# Patient Record
Sex: Female | Born: 1935
Health system: Southern US, Community
[De-identification: ages and names within clinical notes are randomized; demographics above are authoritative.]

## PROBLEM LIST (undated history)

## (undated) DIAGNOSIS — I499 Cardiac arrhythmia, unspecified: Secondary | ICD-10-CM

## (undated) DIAGNOSIS — E785 Hyperlipidemia, unspecified: Secondary | ICD-10-CM

## (undated) DIAGNOSIS — R413 Other amnesia: Secondary | ICD-10-CM

## (undated) DIAGNOSIS — R918 Other nonspecific abnormal finding of lung field: Secondary | ICD-10-CM

## (undated) DIAGNOSIS — K5790 Diverticulosis of intestine, part unspecified, without perforation or abscess without bleeding: Secondary | ICD-10-CM

## (undated) DIAGNOSIS — M1990 Unspecified osteoarthritis, unspecified site: Secondary | ICD-10-CM

## (undated) DIAGNOSIS — F419 Anxiety disorder, unspecified: Secondary | ICD-10-CM

## (undated) DIAGNOSIS — F32A Depression, unspecified: Secondary | ICD-10-CM

## (undated) DIAGNOSIS — F0280 Dementia in other diseases classified elsewhere without behavioral disturbance: Secondary | ICD-10-CM

## (undated) DIAGNOSIS — F329 Major depressive disorder, single episode, unspecified: Secondary | ICD-10-CM

## (undated) DIAGNOSIS — H3530 Unspecified macular degeneration: Secondary | ICD-10-CM

## (undated) DIAGNOSIS — I10 Essential (primary) hypertension: Secondary | ICD-10-CM

## (undated) DIAGNOSIS — G309 Alzheimer's disease, unspecified: Secondary | ICD-10-CM

## (undated) HISTORY — DX: Anxiety disorder, unspecified: F41.9

## (undated) HISTORY — DX: Major depressive disorder, single episode, unspecified: F32.9

## (undated) HISTORY — PX: BREAST ENHANCEMENT SURGERY: SHX7

## (undated) HISTORY — DX: Hyperlipidemia, unspecified: E78.5

## (undated) HISTORY — DX: Dementia in other diseases classified elsewhere without behavioral disturbance: F02.80

## (undated) HISTORY — DX: Depression, unspecified: F32.A

## (undated) HISTORY — DX: Other nonspecific abnormal finding of lung field: R91.8

## (undated) HISTORY — DX: Unspecified osteoarthritis, unspecified site: M19.90

## (undated) HISTORY — PX: TONSILLECTOMY: SUR1361

## (undated) HISTORY — DX: Essential (primary) hypertension: I10

## (undated) HISTORY — DX: Alzheimer's disease, unspecified: G30.9

## (undated) HISTORY — DX: Other amnesia: R41.3

## (undated) HISTORY — DX: Unspecified macular degeneration: H35.30

## (undated) HISTORY — DX: Diverticulosis of intestine, part unspecified, without perforation or abscess without bleeding: K57.90

---

## 1998-06-15 ENCOUNTER — Other Ambulatory Visit: Admission: RE | Admit: 1998-06-15 | Discharge: 1998-06-15 | Payer: Self-pay | Admitting: Obstetrics and Gynecology

## 1998-09-13 ENCOUNTER — Encounter: Payer: Self-pay | Admitting: Plastic Surgery

## 1998-09-13 ENCOUNTER — Ambulatory Visit (HOSPITAL_COMMUNITY): Admission: RE | Admit: 1998-09-13 | Discharge: 1998-09-13 | Payer: Self-pay | Admitting: Plastic Surgery

## 1998-09-19 ENCOUNTER — Other Ambulatory Visit: Admission: RE | Admit: 1998-09-19 | Discharge: 1998-09-19 | Payer: Self-pay | Admitting: Plastic Surgery

## 1999-06-13 ENCOUNTER — Encounter: Payer: Self-pay | Admitting: Orthopedic Surgery

## 1999-06-13 ENCOUNTER — Encounter: Admission: RE | Admit: 1999-06-13 | Discharge: 1999-06-13 | Payer: Self-pay | Admitting: Orthopedic Surgery

## 1999-09-06 ENCOUNTER — Other Ambulatory Visit: Admission: RE | Admit: 1999-09-06 | Discharge: 1999-09-06 | Payer: Self-pay | Admitting: Obstetrics and Gynecology

## 2001-05-21 ENCOUNTER — Other Ambulatory Visit: Admission: RE | Admit: 2001-05-21 | Discharge: 2001-05-21 | Payer: Self-pay | Admitting: Obstetrics and Gynecology

## 2001-05-27 ENCOUNTER — Encounter: Payer: Self-pay | Admitting: Obstetrics and Gynecology

## 2001-05-27 ENCOUNTER — Encounter: Admission: RE | Admit: 2001-05-27 | Discharge: 2001-05-27 | Payer: Self-pay | Admitting: Obstetrics and Gynecology

## 2002-03-24 ENCOUNTER — Encounter (INDEPENDENT_AMBULATORY_CARE_PROVIDER_SITE_OTHER): Payer: Self-pay | Admitting: Family Medicine

## 2003-06-28 ENCOUNTER — Other Ambulatory Visit: Admission: RE | Admit: 2003-06-28 | Discharge: 2003-06-28 | Payer: Self-pay | Admitting: Obstetrics and Gynecology

## 2003-07-30 HISTORY — PX: OTHER SURGICAL HISTORY: SHX169

## 2004-12-13 ENCOUNTER — Other Ambulatory Visit: Admission: RE | Admit: 2004-12-13 | Discharge: 2004-12-13 | Payer: Self-pay | Admitting: Obstetrics and Gynecology

## 2006-07-07 ENCOUNTER — Encounter (INDEPENDENT_AMBULATORY_CARE_PROVIDER_SITE_OTHER): Payer: Self-pay | Admitting: Family Medicine

## 2006-10-09 ENCOUNTER — Other Ambulatory Visit: Admission: RE | Admit: 2006-10-09 | Discharge: 2006-10-09 | Payer: Self-pay | Admitting: Obstetrics & Gynecology

## 2007-04-24 ENCOUNTER — Ambulatory Visit: Payer: Self-pay | Admitting: Family Medicine

## 2007-04-24 DIAGNOSIS — J984 Other disorders of lung: Secondary | ICD-10-CM | POA: Insufficient documentation

## 2007-04-24 DIAGNOSIS — H9190 Unspecified hearing loss, unspecified ear: Secondary | ICD-10-CM | POA: Insufficient documentation

## 2007-04-24 DIAGNOSIS — G4700 Insomnia, unspecified: Secondary | ICD-10-CM | POA: Insufficient documentation

## 2007-04-24 DIAGNOSIS — E785 Hyperlipidemia, unspecified: Secondary | ICD-10-CM | POA: Insufficient documentation

## 2007-04-24 DIAGNOSIS — J301 Allergic rhinitis due to pollen: Secondary | ICD-10-CM | POA: Insufficient documentation

## 2007-04-24 DIAGNOSIS — R498 Other voice and resonance disorders: Secondary | ICD-10-CM | POA: Insufficient documentation

## 2007-05-01 ENCOUNTER — Encounter (INDEPENDENT_AMBULATORY_CARE_PROVIDER_SITE_OTHER): Payer: Self-pay | Admitting: Family Medicine

## 2007-05-05 LAB — CONVERTED CEMR LAB
ALT: 17 units/L (ref 0–35)
Alkaline Phosphatase: 62 units/L (ref 39–117)
CO2: 25 meq/L (ref 19–32)
Calcium: 8.9 mg/dL (ref 8.4–10.5)
Cholesterol: 212 mg/dL — ABNORMAL HIGH (ref 0–200)
Eosinophils Relative: 2 % (ref 0–5)
Glucose, Bld: 91 mg/dL (ref 70–99)
HCT: 43.8 % (ref 36.0–46.0)
Hemoglobin: 14.4 g/dL (ref 12.0–15.0)
LDL Cholesterol: 116 mg/dL — ABNORMAL HIGH (ref 0–99)
Lymphocytes Relative: 29 % (ref 12–46)
MCHC: 32.9 g/dL (ref 30.0–36.0)
Monocytes Absolute: 0.7 10*3/uL (ref 0.2–0.7)
Neutrophils Relative %: 60 % (ref 43–77)
Platelets: 244 10*3/uL (ref 150–400)
Sodium: 143 meq/L (ref 135–145)
Total Protein: 6.3 g/dL (ref 6.0–8.3)
WBC: 7.8 10*3/uL (ref 4.0–10.5)

## 2007-05-08 ENCOUNTER — Ambulatory Visit: Payer: Self-pay | Admitting: Family Medicine

## 2007-05-08 DIAGNOSIS — F341 Dysthymic disorder: Secondary | ICD-10-CM | POA: Insufficient documentation

## 2007-05-08 LAB — CONVERTED CEMR LAB
Cholesterol, target level: 200 mg/dL
HDL goal, serum: 40 mg/dL
LDL Goal: 160 mg/dL

## 2007-07-08 ENCOUNTER — Telehealth (INDEPENDENT_AMBULATORY_CARE_PROVIDER_SITE_OTHER): Payer: Self-pay | Admitting: *Deleted

## 2007-07-08 ENCOUNTER — Ambulatory Visit: Payer: Self-pay | Admitting: Family Medicine

## 2007-07-08 DIAGNOSIS — M949 Disorder of cartilage, unspecified: Secondary | ICD-10-CM

## 2007-07-08 DIAGNOSIS — M899 Disorder of bone, unspecified: Secondary | ICD-10-CM | POA: Insufficient documentation

## 2007-07-09 ENCOUNTER — Encounter (INDEPENDENT_AMBULATORY_CARE_PROVIDER_SITE_OTHER): Payer: Self-pay | Admitting: Family Medicine

## 2007-07-09 ENCOUNTER — Telehealth (INDEPENDENT_AMBULATORY_CARE_PROVIDER_SITE_OTHER): Payer: Self-pay | Admitting: Family Medicine

## 2007-07-09 ENCOUNTER — Telehealth (INDEPENDENT_AMBULATORY_CARE_PROVIDER_SITE_OTHER): Payer: Self-pay | Admitting: *Deleted

## 2007-07-10 ENCOUNTER — Ambulatory Visit (HOSPITAL_COMMUNITY): Admission: RE | Admit: 2007-07-10 | Discharge: 2007-07-10 | Payer: Self-pay | Admitting: Family Medicine

## 2007-07-10 ENCOUNTER — Encounter (INDEPENDENT_AMBULATORY_CARE_PROVIDER_SITE_OTHER): Payer: Self-pay | Admitting: Family Medicine

## 2007-07-13 ENCOUNTER — Ambulatory Visit (HOSPITAL_COMMUNITY): Admission: RE | Admit: 2007-07-13 | Discharge: 2007-07-13 | Payer: Self-pay | Admitting: Family Medicine

## 2007-07-14 ENCOUNTER — Telehealth (INDEPENDENT_AMBULATORY_CARE_PROVIDER_SITE_OTHER): Payer: Self-pay | Admitting: *Deleted

## 2007-07-28 ENCOUNTER — Telehealth (INDEPENDENT_AMBULATORY_CARE_PROVIDER_SITE_OTHER): Payer: Self-pay | Admitting: *Deleted

## 2007-07-30 HISTORY — PX: CATARACT EXTRACTION: SUR2

## 2007-08-17 ENCOUNTER — Telehealth (INDEPENDENT_AMBULATORY_CARE_PROVIDER_SITE_OTHER): Payer: Self-pay | Admitting: *Deleted

## 2007-08-17 ENCOUNTER — Ambulatory Visit: Payer: Self-pay | Admitting: Family Medicine

## 2007-08-17 DIAGNOSIS — R413 Other amnesia: Secondary | ICD-10-CM | POA: Insufficient documentation

## 2007-08-17 DIAGNOSIS — R9409 Abnormal results of other function studies of central nervous system: Secondary | ICD-10-CM | POA: Insufficient documentation

## 2007-08-18 ENCOUNTER — Encounter (INDEPENDENT_AMBULATORY_CARE_PROVIDER_SITE_OTHER): Payer: Self-pay | Admitting: Family Medicine

## 2007-08-25 ENCOUNTER — Ambulatory Visit: Payer: Self-pay | Admitting: Family Medicine

## 2007-09-30 ENCOUNTER — Ambulatory Visit: Payer: Self-pay | Admitting: Vascular Surgery

## 2007-10-07 ENCOUNTER — Encounter (INDEPENDENT_AMBULATORY_CARE_PROVIDER_SITE_OTHER): Payer: Self-pay | Admitting: Family Medicine

## 2007-10-22 ENCOUNTER — Telehealth (INDEPENDENT_AMBULATORY_CARE_PROVIDER_SITE_OTHER): Payer: Self-pay | Admitting: *Deleted

## 2007-11-03 ENCOUNTER — Telehealth (INDEPENDENT_AMBULATORY_CARE_PROVIDER_SITE_OTHER): Payer: Self-pay | Admitting: Family Medicine

## 2007-11-10 ENCOUNTER — Telehealth (INDEPENDENT_AMBULATORY_CARE_PROVIDER_SITE_OTHER): Payer: Self-pay | Admitting: Family Medicine

## 2007-11-16 ENCOUNTER — Telehealth (INDEPENDENT_AMBULATORY_CARE_PROVIDER_SITE_OTHER): Payer: Self-pay | Admitting: Family Medicine

## 2007-11-17 ENCOUNTER — Ambulatory Visit: Payer: Self-pay | Admitting: Family Medicine

## 2007-11-17 DIAGNOSIS — R030 Elevated blood-pressure reading, without diagnosis of hypertension: Secondary | ICD-10-CM | POA: Insufficient documentation

## 2007-11-18 ENCOUNTER — Telehealth (INDEPENDENT_AMBULATORY_CARE_PROVIDER_SITE_OTHER): Payer: Self-pay | Admitting: *Deleted

## 2007-11-27 ENCOUNTER — Other Ambulatory Visit: Admission: RE | Admit: 2007-11-27 | Discharge: 2007-11-27 | Payer: Self-pay | Admitting: Obstetrics & Gynecology

## 2007-11-27 ENCOUNTER — Telehealth (INDEPENDENT_AMBULATORY_CARE_PROVIDER_SITE_OTHER): Payer: Self-pay | Admitting: *Deleted

## 2007-11-30 ENCOUNTER — Encounter (INDEPENDENT_AMBULATORY_CARE_PROVIDER_SITE_OTHER): Payer: Self-pay | Admitting: Family Medicine

## 2008-02-09 ENCOUNTER — Ambulatory Visit (HOSPITAL_COMMUNITY): Admission: RE | Admit: 2008-02-09 | Discharge: 2008-02-09 | Payer: Self-pay | Admitting: Ophthalmology

## 2008-02-16 ENCOUNTER — Encounter (INDEPENDENT_AMBULATORY_CARE_PROVIDER_SITE_OTHER): Payer: Self-pay | Admitting: Family Medicine

## 2008-02-18 ENCOUNTER — Ambulatory Visit: Payer: Self-pay | Admitting: Family Medicine

## 2008-02-18 DIAGNOSIS — H2640 Unspecified secondary cataract: Secondary | ICD-10-CM | POA: Insufficient documentation

## 2008-02-18 DIAGNOSIS — IMO0002 Reserved for concepts with insufficient information to code with codable children: Secondary | ICD-10-CM | POA: Insufficient documentation

## 2008-02-18 DIAGNOSIS — M1710 Unilateral primary osteoarthritis, unspecified knee: Secondary | ICD-10-CM

## 2008-05-11 ENCOUNTER — Ambulatory Visit: Payer: Self-pay | Admitting: Family Medicine

## 2008-05-11 DIAGNOSIS — H5310 Unspecified subjective visual disturbances: Secondary | ICD-10-CM | POA: Insufficient documentation

## 2008-06-01 ENCOUNTER — Ambulatory Visit: Payer: Self-pay | Admitting: Family Medicine

## 2008-06-02 ENCOUNTER — Encounter (INDEPENDENT_AMBULATORY_CARE_PROVIDER_SITE_OTHER): Payer: Self-pay | Admitting: Family Medicine

## 2008-06-07 ENCOUNTER — Encounter: Payer: Self-pay | Admitting: Orthopedic Surgery

## 2008-06-07 ENCOUNTER — Encounter (INDEPENDENT_AMBULATORY_CARE_PROVIDER_SITE_OTHER): Payer: Self-pay | Admitting: Family Medicine

## 2008-07-29 HISTORY — PX: REFRACTIVE SURGERY: SHX103

## 2008-11-04 ENCOUNTER — Telehealth (INDEPENDENT_AMBULATORY_CARE_PROVIDER_SITE_OTHER): Payer: Self-pay | Admitting: Family Medicine

## 2008-11-14 ENCOUNTER — Telehealth (INDEPENDENT_AMBULATORY_CARE_PROVIDER_SITE_OTHER): Payer: Self-pay | Admitting: Family Medicine

## 2008-11-16 ENCOUNTER — Encounter (INDEPENDENT_AMBULATORY_CARE_PROVIDER_SITE_OTHER): Payer: Self-pay | Admitting: Family Medicine

## 2008-11-17 LAB — CONVERTED CEMR LAB
AST: 20 units/L (ref 0–37)
Alkaline Phosphatase: 62 units/L (ref 39–117)
BUN: 21 mg/dL (ref 6–23)
Basophils Absolute: 0 10*3/uL (ref 0.0–0.1)
Basophils Relative: 0 % (ref 0–1)
Chloride: 106 meq/L (ref 96–112)
Cholesterol: 225 mg/dL — ABNORMAL HIGH (ref 0–200)
Eosinophils Absolute: 0 10*3/uL (ref 0.0–0.7)
Eosinophils Relative: 1 % (ref 0–5)
Glucose, Bld: 114 mg/dL — ABNORMAL HIGH (ref 70–99)
HDL: 81 mg/dL (ref >39)
Hemoglobin: 13.9 g/dL (ref 12.0–15.0)
Lymphocytes Relative: 23 % (ref 12–46)
Lymphs Abs: 1.9 10*3/uL (ref 0.7–4.0)
MCHC: 33.6 g/dL (ref 30.0–36.0)
MCV: 90.2 fL (ref 78.0–100.0)
Neutro Abs: 5.5 10*3/uL (ref 1.7–7.7)
Platelets: 275 10*3/uL (ref 150–400)
RBC: 4.59 M/uL (ref 3.87–5.11)
Total CHOL/HDL Ratio: 2.8
Total Protein: 6.9 g/dL (ref 6.0–8.3)
Triglycerides: 107 mg/dL (ref <150)

## 2008-11-18 ENCOUNTER — Ambulatory Visit: Payer: Self-pay | Admitting: Family Medicine

## 2008-11-18 DIAGNOSIS — R7309 Other abnormal glucose: Secondary | ICD-10-CM | POA: Insufficient documentation

## 2008-11-18 LAB — CONVERTED CEMR LAB

## 2008-12-15 ENCOUNTER — Encounter (INDEPENDENT_AMBULATORY_CARE_PROVIDER_SITE_OTHER): Payer: Self-pay | Admitting: Family Medicine

## 2009-02-23 ENCOUNTER — Ambulatory Visit: Payer: Self-pay | Admitting: Family Medicine

## 2009-02-23 DIAGNOSIS — J069 Acute upper respiratory infection, unspecified: Secondary | ICD-10-CM | POA: Insufficient documentation

## 2009-02-23 DIAGNOSIS — R002 Palpitations: Secondary | ICD-10-CM | POA: Insufficient documentation

## 2009-02-23 DIAGNOSIS — R5383 Other fatigue: Secondary | ICD-10-CM | POA: Insufficient documentation

## 2009-02-23 DIAGNOSIS — R5381 Other malaise: Secondary | ICD-10-CM | POA: Insufficient documentation

## 2009-02-27 ENCOUNTER — Telehealth (INDEPENDENT_AMBULATORY_CARE_PROVIDER_SITE_OTHER): Payer: Self-pay | Admitting: Family Medicine

## 2009-03-20 ENCOUNTER — Telehealth (INDEPENDENT_AMBULATORY_CARE_PROVIDER_SITE_OTHER): Payer: Self-pay | Admitting: *Deleted

## 2009-04-12 ENCOUNTER — Encounter (HOSPITAL_COMMUNITY): Admission: RE | Admit: 2009-04-12 | Discharge: 2009-04-26 | Payer: Self-pay | Admitting: Orthopaedic Surgery

## 2009-04-18 ENCOUNTER — Encounter (INDEPENDENT_AMBULATORY_CARE_PROVIDER_SITE_OTHER): Payer: Self-pay | Admitting: Family Medicine

## 2009-05-26 ENCOUNTER — Ambulatory Visit (HOSPITAL_COMMUNITY): Admission: RE | Admit: 2009-05-26 | Discharge: 2009-05-26 | Payer: Self-pay | Admitting: Orthopaedic Surgery

## 2009-05-29 HISTORY — PX: TOTAL KNEE ARTHROPLASTY: SHX125

## 2009-06-01 ENCOUNTER — Inpatient Hospital Stay (HOSPITAL_COMMUNITY): Admission: RE | Admit: 2009-06-01 | Discharge: 2009-06-04 | Payer: Self-pay | Admitting: Orthopaedic Surgery

## 2009-07-04 ENCOUNTER — Encounter: Payer: Self-pay | Admitting: Orthopaedic Surgery

## 2009-07-29 ENCOUNTER — Encounter: Payer: Self-pay | Admitting: Orthopaedic Surgery

## 2009-08-11 ENCOUNTER — Ambulatory Visit (HOSPITAL_COMMUNITY): Admission: RE | Admit: 2009-08-11 | Discharge: 2009-08-11 | Payer: Self-pay | Admitting: Internal Medicine

## 2010-08-19 ENCOUNTER — Encounter: Payer: Self-pay | Admitting: Obstetrics & Gynecology

## 2010-08-26 LAB — CONVERTED CEMR LAB: Hemoglobin: 15 g/dL

## 2010-09-20 ENCOUNTER — Other Ambulatory Visit: Payer: Self-pay | Admitting: Orthopedic Surgery

## 2010-09-20 DIAGNOSIS — M79672 Pain in left foot: Secondary | ICD-10-CM

## 2010-09-24 ENCOUNTER — Other Ambulatory Visit: Payer: Self-pay

## 2010-10-31 LAB — CBC
HCT: 27.8 % — ABNORMAL LOW (ref 36.0–46.0)
HCT: 31.7 % — ABNORMAL LOW (ref 36.0–46.0)
Hemoglobin: 11.4 g/dL — ABNORMAL LOW (ref 12.0–15.0)
Hemoglobin: 9.7 g/dL — ABNORMAL LOW (ref 12.0–15.0)
MCHC: 34.8 g/dL (ref 30.0–36.0)
MCV: 92.3 fL (ref 78.0–100.0)
MCV: 92.6 fL (ref 78.0–100.0)
Platelets: 174 10*3/uL (ref 150–400)
RBC: 3.01 MIL/uL — ABNORMAL LOW (ref 3.87–5.11)
RBC: 3.43 MIL/uL — ABNORMAL LOW (ref 3.87–5.11)
RBC: 3.56 MIL/uL — ABNORMAL LOW (ref 3.87–5.11)
RDW: 13.4 % (ref 11.5–15.5)
RDW: 13.5 % (ref 11.5–15.5)

## 2010-10-31 LAB — BASIC METABOLIC PANEL
BUN: 10 mg/dL (ref 6–23)
BUN: 5 mg/dL — ABNORMAL LOW (ref 6–23)
CO2: 27 mEq/L (ref 19–32)
CO2: 28 mEq/L (ref 19–32)
Calcium: 7.9 mg/dL — ABNORMAL LOW (ref 8.4–10.5)
GFR calc Af Amer: >60 mL/min (ref >60)
GFR calc non Af Amer: >60 mL/min (ref >60)
GFR calc non Af Amer: >60 mL/min (ref >60)
GFR calc non Af Amer: >60 mL/min (ref >60)
Glucose, Bld: 129 mg/dL — ABNORMAL HIGH (ref 70–99)
Glucose, Bld: 158 mg/dL — ABNORMAL HIGH (ref 70–99)
Potassium: 3.3 mEq/L — ABNORMAL LOW (ref 3.5–5.1)
Potassium: 3.5 mEq/L (ref 3.5–5.1)
Sodium: 135 mEq/L (ref 135–145)

## 2010-10-31 LAB — PROTIME-INR
Prothrombin Time: 13.7 seconds (ref 11.6–15.2)
Prothrombin Time: 20.1 seconds — ABNORMAL HIGH (ref 11.6–15.2)
Prothrombin Time: 21.7 seconds — ABNORMAL HIGH (ref 11.6–15.2)

## 2010-10-31 LAB — OSMOLALITY: Osmolality: 273 mOsm/kg — ABNORMAL LOW (ref 275–300)

## 2010-11-01 LAB — URINE CULTURE
Colony Count: NO GROWTH
Culture: NO GROWTH

## 2010-11-01 LAB — DIFFERENTIAL
Basophils Relative: 0 % (ref 0–1)
Lymphocytes Relative: 37 % (ref 12–46)
Monocytes Relative: 7 % (ref 3–12)
Neutro Abs: 3.7 10*3/uL (ref 1.7–7.7)

## 2010-11-01 LAB — COMPREHENSIVE METABOLIC PANEL
ALT: 18 U/L (ref 0–35)
Albumin: 3.9 g/dL (ref 3.5–5.2)
Alkaline Phosphatase: 65 U/L (ref 39–117)
BUN: 17 mg/dL (ref 6–23)
GFR calc non Af Amer: >60 mL/min (ref >60)
Glucose, Bld: 102 mg/dL — ABNORMAL HIGH (ref 70–99)
Potassium: 4.1 mEq/L (ref 3.5–5.1)
Sodium: 142 mEq/L (ref 135–145)
Total Protein: 6.6 g/dL (ref 6.0–8.3)

## 2010-11-01 LAB — URINE MICROSCOPIC-ADD ON

## 2010-11-01 LAB — URINALYSIS, ROUTINE W REFLEX MICROSCOPIC
Hgb urine dipstick: NEGATIVE
Protein, ur: NEGATIVE mg/dL
Specific Gravity, Urine: 1.022 (ref 1.005–1.030)
Urobilinogen, UA: 0.2 mg/dL (ref 0.0–1.0)

## 2010-11-01 LAB — TYPE AND SCREEN
ABO/RH(D): O POS
Antibody Screen: NEGATIVE

## 2010-11-01 LAB — PROTIME-INR: INR: 0.93 (ref 0.00–1.49)

## 2010-11-01 LAB — CBC
HCT: 41 % (ref 36.0–46.0)
MCHC: 34.4 g/dL (ref 30.0–36.0)
MCV: 92.3 fL (ref 78.0–100.0)
Platelets: 227 10*3/uL (ref 150–400)
RDW: 13.4 % (ref 11.5–15.5)
WBC: 6.8 10*3/uL (ref 4.0–10.5)

## 2010-11-15 ENCOUNTER — Other Ambulatory Visit (HOSPITAL_COMMUNITY): Payer: Self-pay | Admitting: Internal Medicine

## 2010-11-15 DIAGNOSIS — Z139 Encounter for screening, unspecified: Secondary | ICD-10-CM

## 2010-11-22 ENCOUNTER — Ambulatory Visit (HOSPITAL_COMMUNITY)
Admission: RE | Admit: 2010-11-22 | Discharge: 2010-11-22 | Disposition: A | Discharge status: Home / Self Care | Payer: Medicare FFS | Source: Ambulatory Visit | Attending: Internal Medicine | Admitting: Internal Medicine

## 2010-11-22 ENCOUNTER — Other Ambulatory Visit (HOSPITAL_COMMUNITY): Payer: Self-pay | Admitting: Internal Medicine

## 2010-11-22 DIAGNOSIS — Z139 Encounter for screening, unspecified: Secondary | ICD-10-CM

## 2010-11-22 DIAGNOSIS — Z1231 Encounter for screening mammogram for malignant neoplasm of breast: Secondary | ICD-10-CM | POA: Insufficient documentation

## 2010-12-11 NOTE — Assessment & Plan Note (Signed)
OFFICE VISIT   COBIE, LEIDNER  DOB:  Jun 25, 1931                                       09/30/2007  UEAVW#:[id number]   The patient is a 77 year old female referred by Dr. Franchot Heidelberg  for evaluation of asymptomatic celiac artery stenosis.  She denies any  history of abdominal pain or weight loss.  She has no history of  postprandial pain.  She does have a history of some lactose intolerance.  She recently had a CT scan of the chest performed for pulmonary nodules.  The bottom portion of this scan revealed a stenosis of the proximal  celiac artery.  The superior mesenteric and inferior mesenteric arteries  were not visualized on this scan.   Her atherosclerotic risk factors include mild hypertension.  She denies  history of coronary artery disease, diabetes, or an elevated  cholesterol.  She has had no previous operations.   MEDICATIONS:  Include Advil 2 tablets 4 times a day, vitamins and  minerals.   She has a side effect to Elavil, which causes palpitations.  She has a  side effect from Compazine, which caused her to be in a trance-like  state looking at the ceiling.   FAMILY HISTORY:  Remarkable for her mother, who had Alzheimer's disease  and died at age 74.  Her father had diabetes, hypertension, and strokes  and died at age 34.  Her sister is healthy at age 65.   SOCIAL HISTORY:  She is married and has 2 children.  Is an Tree surgeon.  She  is a nonsmoker.  She drinks 1 glass of red wine per day.   REVIEW OF SYSTEMS:  She is 5 feet 5 inches 135 pounds.  She has had a history of some palpitations in the past.  She denies history of asthma, wheezing, or GI bleeding, renal  dysfunction, stroke, TIA, or syncopal episodes.  She has multiple joint pains, especially in her right knee, which she  states might need replacement.  She has a history of some mild depression and anxiety.  She also has had some decrease in visual acuity recently and had  to have  her glasses prescription changed, but still has difficulty seeing.  She  denies any history of amaurosis.   PHYSICAL EXAM:  Blood pressure is 178/79.  HEENT is unremarkable.  She  has 2+ carotid pulses without bruit.  Chest is clear to auscultation.  Cardiac exam is regular rate and rhythm without murmur.  Abdomen is  soft, nontender, nondistended with no palpable mass or bruits.  She has  a 2+ aortic pulsation with aortic diameter estimated at 2.5 to 3 cm.  She has 2+ radial pulses bilaterally.  She has 2+ dorsalis pedis and  posterior tibial pulses bilaterally.   I have reviewed her CT scan of the chest, which showed a very mild  stenosis of the proximal celiac artery.  The distal branches fill and  opacify with contrast.  She also recently had an MRI of the neck and  brain for evaluation of headaches, weakness, and dizziness.  This showed  no focal lesion within the brain matter, and no significant stenosis in  her carotid system.   In summary, the patient is referred for celiac artery stenosis.  She  does have evidence of mild stenosis on CT scan.  However, she does not  have any symptoms of chronic mesenteric ischemia or acute mesenteric  ischemia.  This is essentially asymptomatic.  I do not believe that this  warrants further intervention at this time.  I did discuss with her the  symptoms of postprandial pain, prolonged weight loss, or difficulty  eating, and pain with eating.  I informed her that, if she develops any  of these symptoms, she may need further evaluation.  Otherwise, she will  follow up on an as needed basis.   Janetta Hora. Fields, MD  Electronically Signed   CEF/MEDQ  D:  10/01/2007  T:  10/01/2007  Job:  [id number]

## 2011-02-21 ENCOUNTER — Ambulatory Visit (HOSPITAL_COMMUNITY): Payer: Medicare FFS | Admitting: Psychiatry

## 2011-04-25 LAB — BASIC METABOLIC PANEL
BUN: 20
CO2: 27
Calcium: 9.1
Chloride: 107
Creatinine, Ser: 0.82
Glucose, Bld: 87

## 2011-04-25 LAB — HEMOGLOBIN AND HEMATOCRIT, BLOOD: HCT: 40.9

## 2011-08-20 ENCOUNTER — Other Ambulatory Visit (HOSPITAL_COMMUNITY): Payer: Self-pay | Admitting: Orthopaedic Surgery

## 2011-08-20 DIAGNOSIS — M7540 Impingement syndrome of unspecified shoulder: Secondary | ICD-10-CM

## 2011-08-22 ENCOUNTER — Ambulatory Visit (HOSPITAL_COMMUNITY): Payer: Self-pay

## 2011-10-21 ENCOUNTER — Encounter: Payer: Self-pay | Admitting: Gastroenterology

## 2011-10-21 ENCOUNTER — Ambulatory Visit (INDEPENDENT_AMBULATORY_CARE_PROVIDER_SITE_OTHER): Payer: Medicare Other | Admitting: Gastroenterology

## 2011-10-21 VITALS — BP 149/69 | HR 79 | Temp 98.2°F | Ht 65.0 in | Wt 136.0 lb

## 2011-10-21 DIAGNOSIS — R141 Gas pain: Secondary | ICD-10-CM

## 2011-10-21 DIAGNOSIS — R143 Flatulence: Secondary | ICD-10-CM | POA: Insufficient documentation

## 2011-10-21 NOTE — Progress Notes (Signed)
Primary Care Physician:  Carylon Perches, MD, MD  Primary Gastroenterologist:  Roetta Sessions, MD   Chief Complaint  Patient presents with  . Gas    HPI:  Jennifer Cruz is a 78 y.o. female here as self-referral for further evaluation of chronic excessive flatulence. Symptoms started many years ago. She started avoiding dairy products over 8 years ago. Over the past two years her flatulence as been to the point of very embarrassing and bothersome. Two months ago, she started following strict gluten free diet which helped a little. Denies h/o IDA. Last labs about six months ago. Denies constipation/diarrhea. BM twice daily. On metamucil 2tsp daily for years. No melena, brbpr. Has tried [employer], Beano with no relief.  Notes she has been consuming significant amounts of sugar free cough drops for years. She also uses Splenda numerous times throughout the day.  Only attempted colonoscopy was done at [facility] about 8 years ago. Patient failed conscious sedation and passed out due to pain. Procedure incomplete but followed up same day with barium enema. Patient denies significant findings.     Current Outpatient Prescriptions  Medication Sig Dispense Refill  . amLODipine (NORVASC) 5 MG tablet Take 5 mg by mouth daily.       . fish oil-omega-3 fatty acids 1000 MG capsule Take 2 g by mouth daily.      Marland Kitchen ibuprofen (ADVIL,MOTRIN) 200 MG tablet Take 200 mg by mouth every 6 (six) hours as needed.      Marland Kitchen UNABLE TO FIND Patient on numerous vitamins and could not recall all of them        Allergies as of 10/21/2011 - Review Complete 10/21/2011  Allergen Reaction Noted  . Amitriptyline hcl  04/24/2007  . Prochlorperazine edisylate  04/24/2007    Past Medical History  Diagnosis Date  . Hypertension   . Hyperlipidemia   . Anxiety   . Depression   . Osteoarthritis   . Memory loss     work up negative for Alzheimer's per medical records.  . Pulmonary nodules     per patient work-up completed  at [facility] and no further evaluation needed    Past Surgical History  Procedure Date  . Tonsillectomy   . Total knee arthroplasty 05/2009    right  . Breast enhancement surgery   . Cataract extraction 2009  . Refractive surgery 2010  . Incomplete colonoscopy 2006    inadequate sedation, [facility]    Family History  Problem Relation Age of Onset  . Diabetes Father   . Stroke Father   . Alzheimer's disease Mother     History   Social History  . Marital Status: Married    Spouse Name: N/A    Number of Children: 2  . Years of Education: N/A   Occupational History  . Showed horses     retired  . artist    Social History Main Topics  . Smoking status: Former Smoker -- 0.3 packs/day    Types: Cigarettes  . Smokeless tobacco: Not on file   Comment: quit in college  . Alcohol Use: Yes     a glass of red wine every night  . Drug Use: No  . Sexually Active: Not on file   Other Topics Concern  . Not on file   Social History Narrative  . No narrative on file      ROS:  General: Negative for anorexia, weight loss, fever, chills, fatigue, weakness. Eyes: Negative for vision changes.  ENT:  Negative for hoarseness, difficulty swallowing , nasal congestion. CV: Negative for chest pain, angina, palpitations, dyspnea on exertion, peripheral edema.  Respiratory: Negative for dyspnea at rest, dyspnea on exertion, sputum, wheezing. Chronic cough. GI: See history of present illness. GU:  Negative for dysuria, hematuria, urinary incontinence, urinary frequency, nocturnal urination.  MS: Negative for low back pain. Positive for chronic joint pain. Derm: Negative for rash or itching.  Neuro: Negative for weakness, abnormal sensation, seizure, frequent headaches, memory loss, confusion.  Psych: Negative for anxiety, depression, suicidal ideation, hallucinations.  Endo: Negative for unusual weight change.  Heme: Negative for bruising or bleeding. Allergy: Negative for rash or hives.      Physical Examination:  BP 149/69  Pulse 79  Temp(Src) 98.2 F (36.8 C) (Temporal)  Ht 5\' 5"  (1.651 m)  Wt 136 lb (61.689 kg)  BMI 22.63 kg/m2   General: Well-nourished, well-developed in no acute distress.  Head: Normocephalic, atraumatic.   Eyes: Conjunctiva pink, no icterus. Mouth: Oropharyngeal mucosa moist and pink , no lesions erythema or exudate. Neck: Supple without thyromegaly, masses, or lymphadenopathy.  Lungs: Clear to auscultation bilaterally.  Heart: Regular rate and rhythm, no murmurs rubs or gallops.  Abdomen: Bowel sounds are normal, nontender, nondistended, no hepatosplenomegaly or masses, no abdominal bruits or    hernia , no rebound or guarding.   Rectal: Not performed. Extremities: No lower extremity edema. No clubbing or deformities.  Neuro: Alert and oriented x 4 , grossly normal neurologically.  Skin: Warm and dry, no rash or jaundice.   Psych: Alert and cooperative, normal mood and affect.    Imaging Studies: No results found.

## 2011-10-21 NOTE — Patient Instructions (Signed)
Please stop using cough drops, artificial sweeteners for the next 2 weeks to see if this makes any difference in your excessive flatulence. I will discuss your case with Dr. Jena Gauss, further recommendations to follow.

## 2011-10-21 NOTE — Assessment & Plan Note (Addendum)
Chronic excessive flatulence without other significant GI symptoms. Has failed probiotics and Beano. Some modest improvement on gluten free diet (patient has not been tested for gluten sensitivity). Lactose free diet for 8 years. On metamucil for years but does not feel like it contributes to her symptoms. Advised to stop artificial sweetners and cough drops for couple of weeks to see if any improvement.   Denies alarm symptoms such as pain, change in bowels, blood in stool, weight loss.  Last attempted colonoscopy incomplete but reportedly follow up with barium enema (approximately 2005).   Will discuss work up further with Dr. Jena Gauss regarding ie need for colonoscopy and/or hydrogen breath test?

## 2011-10-21 NOTE — Progress Notes (Signed)
Faxed to PCP

## 2011-10-24 NOTE — Progress Notes (Signed)
Please let pt know. Discussed with Dr. Jena Gauss. He recommends colonoscopy with peds scope to further evaluate abdominal bloating/flatulence. If patient agreeable, please arrange. He said he wants to give phenergan 12.5mg  IV 30 mins before procedure due to failed conscious sedation at [facility].

## 2011-10-30 NOTE — Progress Notes (Signed)
Tried to call pt- LMOM 

## 2011-10-31 NOTE — Progress Notes (Signed)
Pt aware, she said she would check her schedule and call us back. Informed her to ask for Crystal when she calls back.

## 2011-11-04 ENCOUNTER — Telehealth: Payer: Self-pay | Admitting: Gastroenterology

## 2011-11-04 NOTE — Telephone Encounter (Signed)
Noted  

## 2011-11-04 NOTE — Telephone Encounter (Signed)
Pt called back- does not want to proceed with TCS at this time, she stated her symptoms have resolved

## 2011-12-02 ENCOUNTER — Other Ambulatory Visit (HOSPITAL_COMMUNITY): Payer: Self-pay | Admitting: Internal Medicine

## 2011-12-02 DIAGNOSIS — Z139 Encounter for screening, unspecified: Secondary | ICD-10-CM

## 2011-12-05 ENCOUNTER — Ambulatory Visit (HOSPITAL_COMMUNITY)
Admission: RE | Admit: 2011-12-05 | Discharge: 2011-12-05 | Disposition: A | Discharge status: Home / Self Care | Payer: Medicare Other | Source: Ambulatory Visit | Attending: Internal Medicine | Admitting: Internal Medicine

## 2011-12-05 DIAGNOSIS — Z978 Presence of other specified devices: Secondary | ICD-10-CM | POA: Insufficient documentation

## 2011-12-05 DIAGNOSIS — Z139 Encounter for screening, unspecified: Secondary | ICD-10-CM

## 2011-12-05 DIAGNOSIS — Z1231 Encounter for screening mammogram for malignant neoplasm of breast: Secondary | ICD-10-CM | POA: Insufficient documentation

## 2011-12-18 ENCOUNTER — Telehealth: Payer: Self-pay

## 2011-12-18 NOTE — Telephone Encounter (Signed)
LM for pt to call. ( another referral was sent from Dr. Ouida Sills after she had spoke to me on 11/04/2011 and said she did not wish to have a colonoscopy at this time, symptoms resolved).

## 2011-12-19 NOTE — Telephone Encounter (Signed)
Letter send to Dr. Ouida Sills.

## 2012-04-30 ENCOUNTER — Encounter: Payer: Self-pay | Admitting: Urgent Care

## 2012-04-30 ENCOUNTER — Other Ambulatory Visit: Payer: Self-pay | Admitting: Internal Medicine

## 2012-04-30 ENCOUNTER — Ambulatory Visit (INDEPENDENT_AMBULATORY_CARE_PROVIDER_SITE_OTHER): Payer: Medicare Other | Admitting: Urgent Care

## 2012-04-30 VITALS — BP 140/66 | HR 72 | Temp 98.1°F | Ht 65.0 in | Wt 132.2 lb

## 2012-04-30 DIAGNOSIS — Z1211 Encounter for screening for malignant neoplasm of colon: Secondary | ICD-10-CM

## 2012-04-30 DIAGNOSIS — R198 Other specified symptoms and signs involving the digestive system and abdomen: Secondary | ICD-10-CM

## 2012-04-30 MED ORDER — PEG 3350-KCL-NA BICARB-NACL 420 G PO SOLR: 4000.0000 mL | ORAL | Status: DC

## 2012-04-30 NOTE — Assessment & Plan Note (Signed)
Jennifer Cruz is a pleasant 78 y.o. female who is here to reschedule screening colonoscopy with Dr. Jena Gauss. Last seen back in March, however she canceled appointment over concerns with conscious sedation. Plans were made to use Phenergan 12 Mg IV 30 minutes prior to her procedure to augment sedation. This was explained with patient in great detail. She was reassured.  I have discussed risks & benefits which include, but are not limited to, bleeding, infection, perforation & drug reaction.  The patient agrees with this plan & written consent will be obtained.

## 2012-04-30 NOTE — Progress Notes (Signed)
Primary Care Physician:  Carylon Perches, MD Primary Gastroenterologist:  Roetta Sessions, MD   Chief Complaint  Patient presents with  . Colonoscopy   HPI:  Jennifer Cruz is a 78 y.o. female here to set up colonoscopy.  She would also like evaluation of RLQ "bulge" as she believes she may have hernia.  She was scheduled to have a colonoscopy back in March after seeing Tana Coast, [facility]. She later called to cancel. She has a lot of concerns about previous incomplete colonoscopy done at [facility] as she had difficulty with sedation and had a syncopal episode.  She does a lot of gardening & heavy lifting and that is when she sees the bulge.  She was previously passing a lot of flatus, however this improved after stopping Metamucil and oatmeal.  Denies constipation, diarrhea, rectal bleeding, melena or weight loss.  Denies heartburn, indigestion, nausea, vomiting, dysphagia, odynophagia or anorexia.    Current Outpatient Prescriptions  Medication Sig Dispense Refill  . fish oil-omega-3 fatty acids 1000 MG capsule Take 2 g by mouth daily.      Marland Kitchen ibuprofen (ADVIL,MOTRIN) 200 MG tablet Take 200 mg by mouth every 6 (six) hours as needed.      Marland Kitchen UNABLE TO FIND Patient on numerous vitamins and could not recall all of them        Allergies as of 04/30/2012 - Review Complete 04/30/2012  Allergen Reaction Noted  . Amitriptyline hcl  04/24/2007  . Prochlorperazine edisylate  04/24/2007    Past Medical History  Diagnosis Date  . Hypertension   . Hyperlipidemia   . Anxiety   . Depression   . Osteoarthritis   . Memory loss     work up negative for Alzheimer's per medical records.  . Pulmonary nodules     per patient work-up completed at [facility] and no further evaluation needed    Past Surgical History  Procedure Date  . Tonsillectomy   . Total knee arthroplasty 05/2009    right  . Breast enhancement surgery   . Cataract extraction 2009  . Refractive surgery 2010  . Incomplete colonoscopy 2005   inadequate sedation, [facility]    Family History:There is no known family history of colorectal carcinoma , liver disease, or inflammatory bowel disease.  Problem Relation Age of Onset  . Diabetes Father   . Stroke Father   . Alzheimer's disease Mother     History   Social History  . Marital Status: Married    Spouse Name: N/A    Number of Children: 2  . Years of Education: N/A   Occupational History  . Showed horses     retired  . artist    Social History Main Topics  . Smoking status: Former Smoker -- 0.3 packs/day    Types: Cigarettes  . Smokeless tobacco: Not on file   Comment: quit in college  . Alcohol Use: Yes     a glass of red wine every night  . Drug Use: No  . Sexually Active: Not on file  Review of Systems: Gen: Denies any fever, chills, sweats, anorexia, fatigue, weakness, malaise, weight loss, and sleep disorder CV: +trace bilat ankle edema. Denies chest pain, angina, palpitations, syncope, orthopnea, PND, and claudication. Resp: Denies dyspnea at rest, dyspnea with exercise, cough, sputum, wheezing, coughing up blood, and pleurisy. GI: Denies vomiting blood, jaundice, and fecal incontinence.   Derm: Denies rash, itching, dry skin, hives, moles, warts, or unhealing ulcers.  Psych: some anxiety and depression. Denies memory  loss, suicidal ideation, hallucinations, paranoia, and confusion. Heme: Denies bruising, bleeding, and enlarged lymph nodes.  Physical Exam: BP 140/66  Pulse 72  Temp 98.1 F (36.7 C) (Temporal)  Ht 5\' 5"  (1.651 m)  Wt 132 lb 3.2 oz (59.966 kg)  BMI 22.00 kg/m2 No LMP recorded. Patient is not currently having periods (Reason: Other). General:   Alert,  Well-developed, thin, pleasant and cooperative in NAD Head:  Normocephalic and atraumatic. Eyes:  Sclera clear, no icterus.   Conjunctiva pink. Ears:  Normal auditory acuity. Nose:  No deformity, discharge,  or lesions. Mouth:  No deformity or lesions, dentition normal. Neck:  Supple;  no masses or thyromegaly. Lungs:  Clear throughout to auscultation.   No wheezes, crackles, or rhonchi. No acute distress. Heart:  Regular rate and rhythm; no murmurs, clicks, rubs,  or gallops. Abdomen:  Normal bowel sounds.  Soft, nontender and nondistended. No masses, hepatosplenomegaly.  Upon standing, small palpable fullness right lower quadrant without discrete mass or herniation noted. No guarding or rebound tenderness.   Rectal:  Deferred until time of colonoscopy.   Msk:  Symmetrical without gross deformities. Normal posture. Pulses:  Normal pulses noted. Extremities:  Without clubbing or edema. Neurologic:  Alert and  oriented x4;  grossly normal neurologically. Skin:  Intact without significant lesions or rashes. Cervical Nodes:  No significant cervical adenopathy. Psych:  Alert and cooperative. Normal mood and affect.

## 2012-04-30 NOTE — Assessment & Plan Note (Addendum)
New onset right lower quadrant bulge, suspect hernia, worse with movement and lifting. Possible Spigelian hernia.  Further workup pending colonoscopy with peds scope.  If you have severe abdominal pain, redness, warmth, please go to the emergency department

## 2012-04-30 NOTE — Patient Instructions (Addendum)
Followup with your doctor about your swelling in her lower legs Screening colonoscopy with Dr. Jena Gauss If you have severe abdominal pain, redness, warmth, please go to the emergency department We will further evaluate your concerns of possible hernia after colonoscopy

## 2012-04-30 NOTE — Progress Notes (Signed)
Faxed to PCP

## 2012-05-04 ENCOUNTER — Telehealth: Payer: Self-pay | Admitting: Urgent Care

## 2012-05-04 NOTE — Telephone Encounter (Signed)
Discussed w/ patient. Pt does not want to pursue colonoscopy at this time.  She is concerned about sedation.  We discussed this extensively at OV as well.  Offered appt w/ Dr Jena Gauss to discuss.  She declined. Offered additional work-up of RLQ "bulge" ? Hernia with CT scan, pt declines at this time. Pt advised to call us back if she changes her mind.

## 2012-05-13 ENCOUNTER — Ambulatory Visit: Admit: 2012-05-13 | Payer: Self-pay | Admitting: Internal Medicine

## 2012-05-13 SURGERY — COLONOSCOPY
Anesthesia: Moderate Sedation

## 2012-08-12 ENCOUNTER — Ambulatory Visit (INDEPENDENT_AMBULATORY_CARE_PROVIDER_SITE_OTHER): Payer: Medicare Other | Admitting: Internal Medicine

## 2012-08-12 ENCOUNTER — Encounter: Payer: Self-pay | Admitting: Internal Medicine

## 2012-08-12 VITALS — BP 150/76 | HR 84 | Temp 98.4°F | Ht 65.0 in | Wt 135.2 lb

## 2012-08-12 DIAGNOSIS — R143 Flatulence: Secondary | ICD-10-CM

## 2012-08-12 DIAGNOSIS — R141 Gas pain: Secondary | ICD-10-CM

## 2012-08-12 MED ORDER — PEG 3350-KCL-NA BICARB-NACL 420 G PO SOLR: 4000.0000 mL | ORAL | Status: DC

## 2012-08-12 NOTE — Progress Notes (Signed)
Primary Care Physician:  Carylon Perches, MD Primary Gastroenterologist:  Dr. Jena Gauss  Pre-Procedure History & Physical: HPI:  Jennifer Cruz is a 79 y.o. female here for consideration of undergoing a colonoscopy for screening purposes. She states about 8 years ago she had an attempt at a screening colonoscopy at [facility]. She describes inadequate conscious sedation and "passed out". She subsequently underwent an air-contrast barium enema. Details of these procedures are  not known at this time. She also says she had pain and "passed out" during her eye surgery previously. There is no history of malignant hyperthermia or other significant anesthesia difficulties. She states simply she experienced  severe pain with both of these attempted procedures. She's not ever had a complete colonoscopy. She came to see [place] in October of 2013. We were going to set her up for a colonoscopy that time but she backed out.  Her only GI complaint is that of flatulence. She has 1-2 formed bowel movements daily. She denies diarrhea or constipation.. She also denies melena or hematochezia. She denies abdominal pain no upper GI tract symptoms such as odynophagia, dysphagia, early satiety nausea or vomiting. According to the record, she's actually gained 3 pounds since being seen in October of last year.  She tells the she has lactose intolerance. She states she's been on a lactose free diet for years and has gone gluten free over the past year or so without any perceived difference in her intestinal gas. Has also tried a probiotic that time her too much difference in her perceived gas. She currently does not use excessive amounts of artificially sweetened cough drops, candy or gum.  There is no family history of colon polyps or colon cancer.  . Past Medical History  Diagnosis Date  . Hypertension   . Hyperlipidemia   . Anxiety   . Depression   . Osteoarthritis   . Memory loss     work up negative for Alzheimer's per medical  records.  . Pulmonary nodules     per patient work-up completed at [facility] and no further evaluation needed    Past Surgical History  Procedure Date  . Tonsillectomy   . Total knee arthroplasty 05/2009    right  . Breast enhancement surgery   . Cataract extraction 2009  . Refractive surgery 2010  . Incomplete colonoscopy 2005    inadequate sedation, [facility]    Prior to Admission medications   Medication Sig Start Date End Date Taking? Authorizing Provider  fish oil-omega-3 fatty acids 1000 MG capsule Take 2 g by mouth daily.   Yes Historical Provider, MD  UNABLE TO FIND Patient on numerous vitamins and could not recall all of them   Yes Historical Provider, MD  ibuprofen (ADVIL,MOTRIN) 200 MG tablet Take 200 mg by mouth every 6 (six) hours as needed.    Historical Provider, MD  polyethylene glycol-electrolytes (TRILYTE) 420 G solution Take 4,000 mLs by mouth as directed. 04/30/12   Corbin Ade, MD    Allergies as of 08/12/2012 - Review Complete 08/12/2012  Allergen Reaction Noted  . Amitriptyline hcl  04/24/2007  . Prochlorperazine edisylate  04/24/2007    Family History  Problem Relation Age of Onset  . Diabetes Father   . Stroke Father   . Alzheimer's disease Mother     History   Social History  . Marital Status: Married    Spouse Name: N/A    Number of Children: 2  . Years of Education: N/A   Occupational History  .  Showed horses     retired  . artist    Social History Main Topics  . Smoking status: Former Smoker -- 0.3 packs/day    Types: Cigarettes  . Smokeless tobacco: Not on file     Comment: quit in college  . Alcohol Use: Yes     Comment: a glass of red wine every night  . Drug Use: No  . Sexually Active: Not on file   Other Topics Concern  . Not on file   Social History Narrative  . No narrative on file    Review of Systems: See HPI, otherwise negative ROS  Physical Exam: BP 150/76  Pulse 84  Temp 98.4 F (36.9 C) (Oral)  Ht 5\' 5"   (1.651 m)  Wt 135 lb 3.2 oz (61.326 kg)  BMI 22.50 kg/m2 General:   Elderly, well groomed pleasant and conversant lady well-developed , well-nourished  and cooperative in NAD Head:  Normocephalic and atraumatic. Eyes:  Sclera clear, no icterus.   Conjunctiva pink. Ears:  Normal auditory acuity. Nose:  No deformity, discharge,  or lesions. Mouth:  No deformity or lesions, dentition normal. Neck:  Supple; no masses or thyromegaly. Lungs:  Clear throughout to auscultation.   No wheezes, crackles, or rhonchi. No acute distress. Heart:  Regular rate and rhythm; no murmurs, clicks, rubs,  or gallops. Abdomen:  Soft, nontender and nondistended. No masses, hepatosplenomegaly or hernias noted. Normal bowel sounds, without guarding, and without rebound.   Msk:  Symmetrical without gross deformities. Normal posture. Pulses:  Normal pulses noted. Extremities:  Without clubbing or edema. Neurologic:  Alert and  oriented x4;  grossly normal neurologically. Skin:  Intact without significant lesions or rashes. Cervical Nodes:  No significant cervical adenopathy. Psych:  Alert and cooperative. Normal mood and affect.  Impression/Plan: GRACIN SOOHOO is a very pleasant 79 year old lady who is here primarily to discuss undergoing a screening colonoscopy. Her only GI symptoms are that of flatulence which are quite nonspecific.  I discussed the importance of making our best attempt to complete a screening colonoscopy in the near future.  [name] is describing inadequate sedation during her last colonoscopy. It remains to be seen whether or not she had a noncompliant, tight left colon. In her case, I will offer her propofol with the assistance of Dr. Marcos Eke to help insure that she will have adequate sedation experience. I suspect her "passing out" episodes described were vasovagal episodes associated with pain. I have discussed the risks, benefits, limitations, imponderables and alternatives regarding  colonoscopy with the patient. Propofol use has been described. I also explained to her that I will utilize the pediatric colonoscope for the procedure as well. Questions have been answered. All parties agreeable.  In the interim, I have asked her to try digest advantage for gas and bloat a probiotic supplement to see if this makes a difference in managing her gas problems. Further recommendations to follow.

## 2012-08-12 NOTE — Patient Instructions (Addendum)
Schedule screening colonoscopy with propofol  Will try another probiotic; currently we do not have samples in the office  Please try digestive advantage for gas and bloating

## 2012-09-04 ENCOUNTER — Encounter (HOSPITAL_COMMUNITY): Payer: Self-pay | Admitting: Pharmacy Technician

## 2012-09-10 ENCOUNTER — Other Ambulatory Visit (HOSPITAL_COMMUNITY): Payer: Medicare Other

## 2012-09-11 NOTE — Patient Instructions (Addendum)
MODELL FENDRICK  09/11/2012   Your procedure is scheduled on:   09/17/2012  Report to [facility] at  615  AM.  Call this number if you have problems the morning of surgery: [phone]   Remember:   Do not eat food or drink liquids after midnight.   Take these medicines the morning of surgery with A SIP OF WATER: none   Do not wear jewelry, make-up or nail polish.  Do not wear lotions, powders, or perfumes.   Do not shave 48 hours prior to surgery. Men may shave face and neck.  Do not bring valuables to the hospital.  Contacts, dentures or bridgework may not be worn into surgery.  Leave suitcase in the car. After surgery it may be brought to your room.  For patients admitted to the hospital, checkout time is 11:00 AM the day of discharge.   Patients discharged the day of surgery will not be allowed to drive  home.  Name and phone number of your driver: family  Special Instructions: Shower using CHG 2 nights before surgery and the night before surgery.  If you shower the day of surgery use CHG.  Use special wash - you have one bottle of CHG for all showers.  You should use approximately 1/3 of the bottle for each shower.   Please read over the following fact sheets that you were given: Pain Booklet, Coughing and Deep Breathing, MRSA Information, Surgical Site Infection Prevention, Anesthesia Post-op Instructions and Care and Recovery After Surgery Colonoscopy A colonoscopy is an exam to evaluate your entire colon. In this exam, your colon is cleansed. A long fiberoptic tube is inserted through your rectum and into your colon. The fiberoptic scope (endoscope) is a long bundle of enclosed and very flexible fibers. These fibers transmit light to the area examined and send images from that area to your caregiver. Discomfort is usually minimal. You may be given a drug to help you sleep (sedative) during or prior to the procedure. This exam helps to detect lumps (tumors), polyps, inflammation,  and areas of bleeding. Your caregiver may also take a small piece of tissue (biopsy) that will be examined under a microscope. LET YOUR CAREGIVER KNOW ABOUT:   Allergies to food or medicine.  Medicines taken, including vitamins, herbs, eyedrops, over-the-counter medicines, and creams.  Use of steroids (by mouth or creams).  Previous problems with anesthetics or numbing medicines.  History of bleeding problems or blood clots.  Previous surgery.  Other health problems, including diabetes and kidney problems.  Possibility of pregnancy, if this applies. BEFORE THE PROCEDURE   A clear liquid diet may be required for 2 days before the exam.  Ask your caregiver about changing or stopping your regular medications.  Liquid injections (enemas) or laxatives may be required.  A large amount of electrolyte solution may be given to you to drink over a short period of time. This solution is used to clean out your colon.  You should be present 60 minutes prior to your procedure or as directed by your caregiver. AFTER THE PROCEDURE   If you received a sedative or pain relieving medication, you will need to arrange for someone to drive you home.  Occasionally, there is a little blood passed with the first bowel movement. Do not be concerned. FINDING OUT THE RESULTS OF YOUR TEST Not all test results are available during your visit. If your test results are not back during the visit, make an  appointment with your caregiver to find out the results. Do not assume everything is normal if you have not heard from your caregiver or the medical facility. It is important for you to follow up on all of your test results. HOME CARE INSTRUCTIONS   It is not unusual to pass moderate amounts of gas and experience mild abdominal cramping following the procedure. This is due to air being used to inflate your colon during the exam. Walking or a warm pack on your belly (abdomen) may help.  You may resume all  normal meals and activities after sedatives and medicines have worn off.  Only take over-the-counter or prescription medicines for pain, discomfort, or fever as directed by your caregiver. Do not use aspirin or blood thinners if a biopsy was taken. Consult your caregiver for medicine usage if biopsies were taken. SEEK IMMEDIATE MEDICAL CARE IF:   You have a fever.  You pass large blood clots or fill a toilet with blood following the procedure. This may also occur 10 to 14 days following the procedure. This is more likely if a biopsy was taken.  You develop abdominal pain that keeps getting worse and cannot be relieved with medicine. Document Released: 07/12/2000 Document Revised: 10/07/2011 Document Reviewed: 02/25/2008 [facility] Patient Information 2013 [place], [place]. PATIENT INSTRUCTIONS POST-ANESTHESIA  IMMEDIATELY FOLLOWING SURGERY:  Do not drive or operate machinery for the first twenty four hours after surgery.  Do not make any important decisions for twenty four hours after surgery or while taking narcotic pain medications or sedatives.  If you develop intractable nausea and vomiting or a severe headache please notify your doctor immediately.  FOLLOW-UP:  Please make an appointment with your surgeon as instructed. You do not need to follow up with anesthesia unless specifically instructed to do so.  WOUND CARE INSTRUCTIONS (if applicable):  Keep a dry clean dressing on the anesthesia/puncture wound site if there is drainage.  Once the wound has quit draining you may leave it open to air.  Generally you should leave the bandage intact for twenty four hours unless there is drainage.  If the epidural site drains for more than 36-48 hours please call the anesthesia department.  QUESTIONS?:  Please feel free to call your physician or the hospital operator if you have any questions, and they will be happy to assist you.

## 2012-09-14 ENCOUNTER — Other Ambulatory Visit: Payer: Self-pay

## 2012-09-14 ENCOUNTER — Encounter (HOSPITAL_COMMUNITY): Payer: Self-pay

## 2012-09-14 ENCOUNTER — Encounter (HOSPITAL_COMMUNITY)
Admission: RE | Admit: 2012-09-14 | Discharge: 2012-09-14 | Disposition: A | Discharge status: Home / Self Care | Payer: Medicare Other | Source: Ambulatory Visit | Attending: Internal Medicine | Admitting: Internal Medicine

## 2012-09-14 HISTORY — DX: Cardiac arrhythmia, unspecified: I49.9

## 2012-09-14 LAB — HEMOGLOBIN AND HEMATOCRIT, BLOOD
HCT: 42 % (ref 36.0–46.0)
Hemoglobin: 14.3 g/dL (ref 12.0–15.0)

## 2012-09-15 ENCOUNTER — Telehealth: Payer: Self-pay | Admitting: Internal Medicine

## 2012-09-15 NOTE — Telephone Encounter (Signed)
Patient is to not deviate from our recommendations. She is to take  prep which I prescribed.

## 2012-09-15 NOTE — Telephone Encounter (Signed)
Patient called asking for me to give her permission to take a prep called clear lax vs. The Trilyte that was called to her pharmacy. Her husband went to the pharmacy to pick up her prep for her and decided somewhere along the way not to get the trilyte to do clear lax and I told Jennifer Cruz that I did not feel comfortable giving her permission to do a prep that Ive never heard of and that we didn't prescribe.

## 2012-09-17 ENCOUNTER — Encounter (HOSPITAL_COMMUNITY): Payer: Self-pay | Admitting: Anesthesiology

## 2012-09-17 ENCOUNTER — Encounter (HOSPITAL_COMMUNITY)
Admission: RE | Disposition: A | Discharge status: Home Health | Payer: Self-pay | Source: Ambulatory Visit | Attending: Internal Medicine

## 2012-09-17 ENCOUNTER — Ambulatory Visit (HOSPITAL_COMMUNITY)
Admission: RE | Admit: 2012-09-17 | Discharge: 2012-09-17 | Disposition: A | Discharge status: Home Health | Payer: Medicare Other | Source: Ambulatory Visit | Attending: Internal Medicine | Admitting: Internal Medicine

## 2012-09-17 ENCOUNTER — Ambulatory Visit (HOSPITAL_COMMUNITY): Payer: Medicare Other | Admitting: Anesthesiology

## 2012-09-17 ENCOUNTER — Encounter (HOSPITAL_COMMUNITY): Payer: Self-pay | Admitting: *Deleted

## 2012-09-17 DIAGNOSIS — Z79899 Other long term (current) drug therapy: Secondary | ICD-10-CM | POA: Insufficient documentation

## 2012-09-17 DIAGNOSIS — Z1211 Encounter for screening for malignant neoplasm of colon: Secondary | ICD-10-CM

## 2012-09-17 DIAGNOSIS — Z01810 Encounter for preprocedural cardiovascular examination: Secondary | ICD-10-CM | POA: Insufficient documentation

## 2012-09-17 DIAGNOSIS — K5730 Diverticulosis of large intestine without perforation or abscess without bleeding: Secondary | ICD-10-CM | POA: Insufficient documentation

## 2012-09-17 DIAGNOSIS — I10 Essential (primary) hypertension: Secondary | ICD-10-CM | POA: Insufficient documentation

## 2012-09-17 DIAGNOSIS — Z01812 Encounter for preprocedural laboratory examination: Secondary | ICD-10-CM | POA: Insufficient documentation

## 2012-09-17 HISTORY — PX: COLONOSCOPY WITH PROPOFOL: SHX5780

## 2012-09-17 LAB — BASIC METABOLIC PANEL
BUN: 16 mg/dL (ref 6–23)
Calcium: 9.6 mg/dL (ref 8.4–10.5)
Creatinine, Ser: 0.88 mg/dL (ref 0.50–1.10)
GFR calc Af Amer: 71 mL/min — ABNORMAL LOW (ref >90)
GFR calc non Af Amer: 61 mL/min — ABNORMAL LOW (ref >90)

## 2012-09-17 SURGERY — COLONOSCOPY WITH PROPOFOL
Anesthesia: Monitor Anesthesia Care | Site: Rectum

## 2012-09-17 MED ORDER — STERILE WATER FOR IRRIGATION IR SOLN
Status: DC | PRN
  Administered 2012-09-17: 08:00:00

## 2012-09-17 MED ORDER — EPHEDRINE SULFATE 50 MG/ML IJ SOLN
INTRAMUSCULAR | Status: DC | PRN
  Administered 2012-09-17: 5 mg via INTRAVENOUS

## 2012-09-17 MED ORDER — LACTATED RINGERS IV SOLN
INTRAVENOUS | Status: DC
  Administered 2012-09-17: 07:00:00 via INTRAVENOUS

## 2012-09-17 MED ORDER — ONDANSETRON HCL 4 MG/2ML IJ SOLN
INTRAMUSCULAR | Status: AC
  Filled 2012-09-17: qty 2

## 2012-09-17 MED ORDER — PROPOFOL INFUSION 10 MG/ML OPTIME
INTRAVENOUS | Status: DC | PRN
  Administered 2012-09-17: 50 ug/kg/min via INTRAVENOUS

## 2012-09-17 MED ORDER — LIDOCAINE HCL (PF) 1 % IJ SOLN
INTRAMUSCULAR | Status: AC
  Filled 2012-09-17: qty 5

## 2012-09-17 MED ORDER — FENTANYL CITRATE 0.05 MG/ML IJ SOLN
INTRAMUSCULAR | Status: AC
  Filled 2012-09-17: qty 2

## 2012-09-17 MED ORDER — MIDAZOLAM HCL 2 MG/2ML IJ SOLN
1.0000 mg | INTRAMUSCULAR | Status: DC | PRN
  Administered 2012-09-17 (×2): 2 mg via INTRAVENOUS

## 2012-09-17 MED ORDER — GLYCOPYRROLATE 0.2 MG/ML IJ SOLN
INTRAMUSCULAR | Status: AC
  Filled 2012-09-17: qty 1

## 2012-09-17 MED ORDER — EPHEDRINE SULFATE 50 MG/ML IJ SOLN
INTRAMUSCULAR | Status: AC
  Filled 2012-09-17: qty 1

## 2012-09-17 MED ORDER — PROPOFOL 10 MG/ML IV EMUL
INTRAVENOUS | Status: AC
  Filled 2012-09-17: qty 20

## 2012-09-17 MED ORDER — GLYCOPYRROLATE 0.2 MG/ML IJ SOLN
0.2000 mg | Freq: Once | INTRAMUSCULAR | Status: AC
  Administered 2012-09-17: 0.2 mg via INTRAVENOUS

## 2012-09-17 MED ORDER — ONDANSETRON HCL 4 MG/2ML IJ SOLN
4.0000 mg | Freq: Once | INTRAMUSCULAR | Status: AC
  Administered 2012-09-17: 4 mg via INTRAVENOUS

## 2012-09-17 MED ORDER — FENTANYL CITRATE 0.05 MG/ML IJ SOLN
INTRAMUSCULAR | Status: DC | PRN
  Administered 2012-09-17 (×3): 25 ug via INTRAVENOUS

## 2012-09-17 MED ORDER — FENTANYL CITRATE 0.05 MG/ML IJ SOLN
25.0000 ug | INTRAMUSCULAR | Status: DC | PRN
  Administered 2012-09-17: 25 ug via INTRAVENOUS

## 2012-09-17 MED ORDER — MIDAZOLAM HCL 2 MG/2ML IJ SOLN
INTRAMUSCULAR | Status: AC
  Filled 2012-09-17: qty 6

## 2012-09-17 MED ORDER — PROPOFOL 10 MG/ML IV BOLUS
INTRAVENOUS | Status: DC | PRN
  Administered 2012-09-17: 20 mg via INTRAVENOUS

## 2012-09-17 SURGICAL SUPPLY — 21 items
ELECT REM PT RETURN 9FT ADLT (ELECTROSURGICAL)
ELECTRODE REM PT RTRN 9FT ADLT (ELECTROSURGICAL) IMPLANT
FCP BXJMBJMB 240X2.8X (CUTTING FORCEPS)
FLOOR PAD 36X40 (MISCELLANEOUS) ×2
FORCEPS BIOP RAD 4 LRG CAP 4 (CUTTING FORCEPS) IMPLANT
FORCEPS BIOP RJ4 240 W/NDL (CUTTING FORCEPS)
FORCEPS BXJMBJMB 240X2.8X (CUTTING FORCEPS) IMPLANT
INJECTOR/SNARE I SNARE (MISCELLANEOUS) IMPLANT
LUBRICANT JELLY 4.5OZ STERILE (MISCELLANEOUS) ×2 IMPLANT
MANIFOLD NEPTUNE II (INSTRUMENTS) ×2 IMPLANT
NEEDLE SCLEROTHERAPY 25GX240 (NEEDLE) IMPLANT
PAD FLOOR 36X40 (MISCELLANEOUS) ×1 IMPLANT
PROBE APC STR FIRE (PROBE) IMPLANT
PROBE INJECTION GOLD (MISCELLANEOUS)
PROBE INJECTION GOLD 7FR (MISCELLANEOUS) IMPLANT
SNARE ROTATE MED OVAL 20MM (MISCELLANEOUS) IMPLANT
SYR 50ML LL SCALE MARK (SYRINGE) ×2 IMPLANT
TRAP SPECIMEN MUCOUS 40CC (MISCELLANEOUS) IMPLANT
TUBING ENDO SMARTCAP PENTAX (MISCELLANEOUS) ×2 IMPLANT
TUBING IRRIGATION ENDOGATOR (MISCELLANEOUS) ×2 IMPLANT
WATER STERILE IRR 1000ML POUR (IV SOLUTION) ×2 IMPLANT

## 2012-09-17 NOTE — Transfer of Care (Signed)
Immediate Anesthesia Transfer of Care Note  Patient: Jennifer Cruz  Procedure(s) Performed: Procedure(s) with comments: COLONOSCOPY WITH PROPOFOL (N/A) - in cecum @ 0758; total cecal withdrawal time = 20 minutes  Patient Location: PACU  Anesthesia Type:MAC  Level of Consciousness: awake, alert  and oriented  Airway & Oxygen Therapy: Patient Spontanous Breathing and Patient connected to face mask oxygen  Post-op Assessment: Report given to PACU RN  Post vital signs: Reviewed and stable  Complications: No apparent anesthesia complications

## 2012-09-17 NOTE — Anesthesia Preprocedure Evaluation (Signed)
Anesthesia Evaluation  Patient identified by MRN, date of birth, ID band Patient awake    Reviewed: Allergy & Precautions, H&P , NPO status , Patient's Chart, lab work & pertinent test results  Airway Mallampati: II TM Distance: >3 FB     Dental  (+) Teeth Intact   Pulmonary neg pulmonary ROS,  breath sounds clear to auscultation        Cardiovascular hypertension, Pt. on medications + dysrhythmias (palpitations) Rhythm:Regular Rate:Normal     Neuro/Psych PSYCHIATRIC DISORDERS Anxiety Depression    GI/Hepatic negative GI ROS,   Endo/Other    Renal/GU      Musculoskeletal   Abdominal   Peds  Hematology   Anesthesia Other Findings   Reproductive/Obstetrics                           Anesthesia Physical Anesthesia Plan  ASA: II  Anesthesia Plan: MAC   Post-op Pain Management:    Induction: Intravenous  Airway Management Planned: Simple Face Mask  Additional Equipment:   Intra-op Plan:   Post-operative Plan:   Informed Consent: I have reviewed the patients History and Physical, chart, labs and discussed the procedure including the risks, benefits and alternatives for the proposed anesthesia with the patient or authorized representative who has indicated his/her understanding and acceptance.     Plan Discussed with:   Anesthesia Plan Comments:         Anesthesia Quick Evaluation

## 2012-09-17 NOTE — H&P (Signed)
Pre-Procedure History & Physical:  HPI: Jennifer Cruz is a 79 y.o. female here for consideration of undergoing a colonoscopy for screening purposes.  Seen in our office January 15 to set up colonoscopy. Went on vacation to the [place] and now has returned to undergo a screening colonoscopy. She states about 8 years ago she had an attempt at a screening colonoscopy at [facility]. She describes inadequate conscious sedation and "passed out". She subsequently underwent an air-contrast barium enema. Details of these procedures are not known at this time. She also says she had pain and "passed out" during her eye surgery previously. There is no history of malignant hyperthermia or other significant anesthesia difficulties. She states simply she experienced severe pain with both of these attempted procedures. She's not ever had a complete colonoscopy. She came to see [place] in October of 2013. We were going to set her up for a colonoscopy that time but she backed out.  Her only GI complaint is that of flatulence. She has 1-2 formed bowel movements daily. She denies diarrhea or constipation.. She also denies melena or hematochezia. She denies abdominal pain no upper GI tract symptoms such as odynophagia, dysphagia, early satiety nausea or vomiting. According to the record, she's actually gained 3 pounds since being seen in October of last year.  She tells the she has lactose intolerance. She states she's been on a lactose free diet for years and has gone gluten free over the past year or so without any perceived difference in her intestinal gas. Has also tried a probiotic that time her too much difference in her perceived gas. She currently does not use excessive amounts of artificially sweetened cough drops, candy or gum.  There is no family history of colon polyps or colon cancer.  . Past Medical History   Diagnosis  Date   .  Hypertension    .  Hyperlipidemia    .  Anxiety    .  Depression    .   Osteoarthritis    .  Memory loss      work up negative for Alzheimer's per medical records.   .  Pulmonary nodules      per patient work-up completed at [facility] and no further evaluation needed    Past Surgical History   Procedure  Date   .  Tonsillectomy    .  Total knee arthroplasty  05/2009     right   .  Breast enhancement surgery    .  Cataract extraction  2009   .  Refractive surgery  2010   .  Incomplete colonoscopy  2005     inadequate sedation, [facility]    Prior to Admission medications   Medication  Sig  Start Date  End Date  Taking?  Authorizing Provider   fish oil-omega-3 fatty acids 1000 MG capsule  Take 2 g by mouth daily.    Yes  Historical Provider, MD   UNABLE TO FIND  Patient on numerous vitamins and could not recall all of them    Yes  Historical Provider, MD   ibuprofen (ADVIL,MOTRIN) 200 MG tablet  Take 200 mg by mouth every 6 (six) hours as needed.     Historical Provider, MD   polyethylene glycol-electrolytes (TRILYTE) 420 G solution  Take 4,000 mLs by mouth as directed.  04/30/12    Corbin Ade, MD    Allergies as of 08/12/2012 - Review Complete 08/12/2012   Allergen  Reaction  Noted   .  Amitriptyline hcl   04/24/2007   .  Prochlorperazine edisylate   04/24/2007    Family History   Problem  Relation  Age of Onset   .  Diabetes  Father    .  Stroke  Father    .  Alzheimer's disease  Mother     History    Social History   .  Marital Status:  Married     Spouse Name:  N/A     Number of Children:  2   .  Years of Education:  N/A    Occupational History   .  Showed horses      retired   .  artist     Social History Main Topics   .  Smoking status:  Former Smoker -- 0.3 packs/day     Types:  Cigarettes   .  Smokeless tobacco:  Not on file      Comment: quit in college   .  Alcohol Use:  Yes      Comment: a glass of red wine every night   .  Drug Use:  No   .  Sexually Active:  Not on file    Other Topics  Concern   .  Not on file    Social  History Narrative   .  No narrative on file   Review of Systems:  See HPI, otherwise negative ROS  Physical Exam:  General: Elderly, well groomed pleasant and conversant lady well-developed , well-nourished and cooperative in NAD  Head: Normocephalic and atraumatic.  Eyes: Sclera clear, no icterus. Conjunctiva pink.  Ears: Normal auditory acuity.  Nose: No deformity, discharge, or lesions.  Mouth: No deformity or lesions, dentition normal.  Neck: Supple; no masses or thyromegaly.  Lungs: Clear throughout to auscultation. No wheezes, crackles, or rhonchi. No acute distress.  Heart: Regular rate and rhythm; no murmurs, clicks, rubs, or gallops.  Abdomen: Soft, nontender and nondistended. No masses, hepatosplenomegaly or hernias noted. Normal bowel sounds, without guarding, and without rebound.  Msk: Symmetrical without gross deformities. Normal posture.  Pulses: Normal pulses noted.  Extremities: Without clubbing or edema.  Neurologic: Alert and oriented x4; grossly normal neurologically.  Skin: Intact without significant lesions or rashes.  Cervical Nodes: No significant cervical adenopathy.  Psych: Alert and cooperative. Normal mood and affect.  Impression/Plan:  Jennifer Cruz is a very pleasant 79 year old lady who is here primarily to discuss undergoing a screening colonoscopy. Her only GI symptoms are that of flatulence which are quite nonspecific.  I discussed the importance of making our best attempt to complete a screening colonoscopy in the near future. Jennifer Cruz is describing inadequate sedation during her last colonoscopy. It remains to be seen whether or not she had a noncompliant, tight left colon. In her case, I will offer her propofol with the assistance of Dr. Marcos Eke to help insure that she will have adequate sedation experience. I suspect her "passing out" episodes described were vasovagal episodes associated with pain.  I have discussed the risks, benefits, limitations,  imponderables and alternatives regarding colonoscopy with the patient. Propofol use has been described. I also explained to her that I will utilize the pediatric colonoscope for the procedure as well. Questions have been answered. All parties agreeable.  In the interim, I have asked her to try digest advantage for gas and bloat a probiotic supplement to see if this makes a difference in managing her gas problems. Further recommendations to follow.

## 2012-09-17 NOTE — Anesthesia Postprocedure Evaluation (Signed)
  Anesthesia Post-op Note  Patient: Jennifer Cruz  Procedure(s) Performed: Procedure(s) with comments: COLONOSCOPY WITH PROPOFOL (N/A) - in cecum @ 0758; total cecal withdrawal time = 20 minutes  Patient Location: PACU  Anesthesia Type:MAC  Level of Consciousness: awake, alert  and oriented  Airway and Oxygen Therapy: Patient Spontanous Breathing  Post-op Pain: none  Post-op Assessment: Post-op Vital signs reviewed, Patient's Cardiovascular Status Stable, Respiratory Function Stable, Patent Airway and No signs of Nausea or vomiting  Post-op Vital Signs: Reviewed and stable  Complications: No apparent anesthesia complications

## 2012-09-17 NOTE — Op Note (Signed)
[facility] [address] [place], [address]   COLONOSCOPY PROCEDURE REPORT  PATIENT: Jennifer Cruz, Jennifer Cruz  MR#:         [id number] BIRTHDATE: Jun 28, 1931 , 81  yrs. old GENDER: Female ENDOSCOPIST: R.  Roetta Sessions, MD FACP FACG REFERRED BY:  Carylon Perches, M.D. PROCEDURE DATE:  09/17/2012 PROCEDURE:     Screening ileocolonoscopy  INDICATIONS: First ever complete average risk screening colonoscopy  INFORMED CONSENT:  The risks, benefits, alternatives and imponderables including but not limited to bleeding, perforation as well as the possibility of a missed lesion have been reviewed.  The potential for biopsy, lesion removal, etc. have also been discussed.  Questions have been answered.  All parties agreeable. Please see the history and physical in the medical record for more information.  MEDICATIONS: Deep sedation per Dr. [facility]  DESCRIPTION OF PROCEDURE:  After a digital rectal exam was performed, the     colonoscope was advanced from the anus through the rectum and colon to the area of the cecum, ileocecal valve and appendiceal orifice.  The cecum was deeply intubated.  These structures were well-seen and photographed for the record.  From the level of the cecum and ileocecal valve, the scope was slowly and cautiously withdrawn.  The mucosal surfaces were carefully surveyed utilizing scope tip deflection to facilitate fold flattening as needed.  The scope was pulled down into the rectum where a thorough examination including retroflexion was performed.     FINDINGS:  Adequate preparation.  Normal rectum. Pancolonic diverticulosis; otherwise, the remainder of the colonic mucosa appeared normal. Is notable the left colon was somewhat stiff and noncompliant. However, I was able to negotiate it fairly easily with a pediatric colonoscope.The distal 10 cm of terminal ileal mucosa appeared normal.  THERAPEUTIC / DIAGNOSTIC MANEUVERS PERFORMED:   None  COMPLICATIONS: None  CECAL WITHDRAWAL TIME:  21 minutes  IMPRESSION: Colonic diverticulosis  RECOMMENDATIONS:  Diverticulosis information provided. Try digestive advantage for gas/ bloat as a probiotic supplement for her bloating symptoms.  Followup appointment with me as needed.   _______________________________ eSigned:  R. Roetta Sessions, MD FACP [facility] 09/17/2012 8:43 AM   CC:    PATIENT NAME:  Shaniece, Bussa MR#: [id number]

## 2012-09-18 ENCOUNTER — Encounter (HOSPITAL_COMMUNITY): Payer: Self-pay | Admitting: Internal Medicine

## 2012-09-23 ENCOUNTER — Telehealth: Payer: Self-pay | Admitting: Internal Medicine

## 2012-09-23 NOTE — Telephone Encounter (Signed)
Spoke with pt- informed her that [facility] did not do any biopsies of her colon and therefore she does not have any pathology reports. Advised her to call us if she had any problems.

## 2012-09-23 NOTE — Telephone Encounter (Signed)
Pt is upset that no one has called her with her results. Her tcs was 2/20. I told her that once we get the results and [facility] has reviewed someone will be in touch with her and determine when she will need an OV to FU. Please call her at [phone]

## 2012-09-25 ENCOUNTER — Telehealth: Payer: Self-pay | Admitting: Internal Medicine

## 2012-09-25 NOTE — Telephone Encounter (Signed)
I called patient at home, returning  her call made earlier in the week. I was told by her husband she was in [place]; he did not feel that she really had any questions and felt she was very pleased with the outcome of her colonoscopy along with the findings. I told him I would be glad to talk with her at any time

## 2012-09-28 NOTE — Telephone Encounter (Signed)
See telephone note.

## 2013-03-03 ENCOUNTER — Other Ambulatory Visit: Payer: Self-pay

## 2013-06-03 ENCOUNTER — Other Ambulatory Visit: Payer: Self-pay

## 2014-09-28 DIAGNOSIS — H524 Presbyopia: Secondary | ICD-10-CM | POA: Diagnosis not present

## 2014-09-28 DIAGNOSIS — H5210 Myopia, unspecified eye: Secondary | ICD-10-CM | POA: Diagnosis not present

## 2014-09-28 DIAGNOSIS — H4011X1 Primary open-angle glaucoma, mild stage: Secondary | ICD-10-CM | POA: Diagnosis not present

## 2014-12-07 DIAGNOSIS — M7072 Other bursitis of hip, left hip: Secondary | ICD-10-CM | POA: Diagnosis not present

## 2015-01-02 ENCOUNTER — Ambulatory Visit: Payer: Self-pay | Admitting: Gastroenterology

## 2015-01-02 ENCOUNTER — Telehealth: Payer: Self-pay | Admitting: Gastroenterology

## 2015-01-02 NOTE — Telephone Encounter (Signed)
Pt was a no show

## 2015-01-05 ENCOUNTER — Encounter: Payer: Self-pay | Admitting: Gastroenterology

## 2015-01-05 ENCOUNTER — Ambulatory Visit (INDEPENDENT_AMBULATORY_CARE_PROVIDER_SITE_OTHER): Payer: Commercial Managed Care - HMO | Admitting: Gastroenterology

## 2015-01-05 VITALS — BP 157/72 | HR 99 | Temp 98.0°F | Ht 65.0 in | Wt 131.2 lb

## 2015-01-05 DIAGNOSIS — R195 Other fecal abnormalities: Secondary | ICD-10-CM | POA: Insufficient documentation

## 2015-01-05 DIAGNOSIS — R194 Change in bowel habit: Secondary | ICD-10-CM | POA: Diagnosis not present

## 2015-01-05 MED ORDER — POLYETHYLENE GLYCOL 3350 17 GM/SCOOP PO POWD: ORAL | Status: DC

## 2015-01-05 NOTE — Progress Notes (Signed)
Primary Care Physician: Asencion Noble, MD  Primary Gastroenterologist:  Garfield Cornea, MD   Chief Complaint  Patient presents with  . Follow-up  . change in bowel habits    HPI: Jennifer Cruz is a 81 y.o. female here for further evaluation of bowel habit change. Last seen in 2014 at time of colonoscopy (first ever). She had colonic diverticulosis. She has been having about 2 stools per day. Few week h/o change in bowel habits. She reports her stool caliber is much smaller than before and stools are short. Less output then before. No rectal pain or discomfort. Metamucil prn, now daily but no significant improvement so stopped. No abdominal pain. Good appetite. No new medication. No new foods. Stopped drinking wine about six months ago, was drinking one before dinner. Was having trouble sleeping so stopped to see if it would help but it didn't. No heartburn, weight loss, dysphagia.    Current Outpatient Prescriptions  Medication Sig Dispense Refill  . fish oil-omega-3 fatty acids 1000 MG capsule Take 2 g by mouth daily.    Marland Kitchen ibuprofen (ADVIL,MOTRIN) 100 MG tablet Take 100 mg by mouth every 4 (four) hours as needed for fever.    Marland Kitchen UNABLE TO FIND Patient on numerous vitamins and could not recall all of them     No current facility-administered medications for this visit.    Allergies as of 01/05/2015 - Review Complete 01/05/2015  Allergen Reaction Noted  . Amitriptyline hcl  04/24/2007  . Prochlorperazine edisylate  04/24/2007   Past Medical History  Diagnosis Date  . Hypertension   . Hyperlipidemia   . Anxiety   . Depression   . Osteoarthritis   . Memory loss     work up negative for Alzheimer's per medical records.  . Pulmonary nodules     per patient work-up completed at [facility] and no further evaluation needed  . Dysrhythmia     palpitations  . Diverticulosis    Past Surgical History  Procedure Laterality Date  . Tonsillectomy    . Total knee arthroplasty  05/2009      right  . Breast enhancement surgery    . Cataract extraction  2009  . Refractive surgery  2010  . Incomplete colonoscopy  2005    inadequate sedation, [facility]  . Colonoscopy with propofol N/A 09/17/2012    Dr.Rourk- normal rectum, pancolonic diverticulosis: o/w the remainder of the colonic mucosa appeared normal. it is notable the left colon was somewhat stiff and noncompliant.    Family History  Problem Relation Age of Onset  . Diabetes Father   . Stroke Father   . Alzheimer's disease Mother    History   Social History  . Marital Status: Married    Spouse Name: N/A  . Number of Children: 2  . Years of Education: N/A   Occupational History  . Showed horses     retired  . artist    Social History Main Topics  . Smoking status: Former Smoker -- 0.30 packs/day for 2 years    Types: Cigarettes    Quit date: 09/14/1952  . Smokeless tobacco: Not on file     Comment: quit in college  . Alcohol Use: 0.0 oz/week    0 Standard drinks or equivalent per week     Comment: a glass of red wine every night, none since 07/2014  . Drug Use: No  . Sexual Activity: Yes    Birth Control/ Protection: Post-menopausal   Other  Topics Concern  . None   Social History Narrative    ROS:  General: Negative for anorexia, weight loss, fever, chills, fatigue, weakness. ENT: Negative for hoarseness, difficulty swallowing , nasal congestion. CV: Negative for chest pain, angina, palpitations, dyspnea on exertion, peripheral edema.  Respiratory: Negative for dyspnea at rest, dyspnea on exertion, cough, sputum, wheezing.  GI: See history of present illness. GU:  Negative for dysuria, hematuria, urinary incontinence, urinary frequency, nocturnal urination.  Endo: Negative for unusual weight change.    Physical Examination:   BP 157/72 mmHg  Pulse 99  Temp(Src) 98 F (36.7 C)  Ht 5\' 5"  (1.651 m)  Wt 131 lb 3.2 oz (59.512 kg)  BMI 21.83 kg/m2  General: Well-nourished, well-developed in no  acute distress.  Eyes: No icterus. Mouth: Oropharyngeal mucosa moist and pink , no lesions erythema or exudate. Lungs: Clear to auscultation bilaterally.  Heart: Regular rate and rhythm, no murmurs rubs or gallops.  Abdomen: Bowel sounds are normal, nontender, nondistended, no hepatosplenomegaly or masses, no abdominal bruits or hernia , no rebound or guarding.   Rectal exam: no masses, normal sphincter tone, heme - stool.  Extremities: No lower extremity edema. No clubbing or deformities. Neuro: Alert and oriented x 4   Skin: Warm and dry, no jaundice.   Psych: Alert and cooperative, normal mood and affect.  Labs:  None available.  Imaging Studies: No results found.

## 2015-01-05 NOTE — Patient Instructions (Signed)
1. No abnormality seen on exam today. It is very reassuring that your colonoscopy which was 2 years ago was unremarkable as far as polyps. Very unlikely that you have developed colon cancer. I will discuss this further with Dr. Gala Romney but I do not feel that he will require another colonoscopy. 2. Please try MiraLAX, 1/2 to 1 capful daily as needed, hold for diarrhea. 3. Return to the office in 6 weeks for follow-up.

## 2015-01-08 ENCOUNTER — Encounter: Payer: Self-pay | Admitting: Gastroenterology

## 2015-01-08 NOTE — Assessment & Plan Note (Signed)
Change in stool caliber and output. Colonoscopy 2 years ago unremarkable. Rectal exam unremarkable. Trial of Miralax 1/2 to 1 capful daily prn. Return to the office in six weeks. To discuss with Dr. Gala Romney per patient request but doubt that colonoscopy will be necessary at this time.

## 2015-01-11 NOTE — Progress Notes (Signed)
cc'd to pcp 

## 2015-01-20 NOTE — Progress Notes (Addendum)
Please let patient know that I discussed her symptoms with Dr. Gala Romney. He states no need for colonoscopy. States "decreased stool caliber or pencil thin stools" have not be clinically significant or helpful in his experience. Usually does not correlate with significant pathology. Agree with MiraLAX. He also suggested trying bulking agent such as Metamucil. And return to the office as planned.

## 2015-01-24 ENCOUNTER — Ambulatory Visit: Payer: Commercial Managed Care - HMO | Admitting: Internal Medicine

## 2015-01-24 ENCOUNTER — Telehealth: Payer: Self-pay | Admitting: Internal Medicine

## 2015-01-24 ENCOUNTER — Ambulatory Visit: Payer: Self-pay | Admitting: Internal Medicine

## 2015-01-24 NOTE — Telephone Encounter (Signed)
Pt came to front office this morning and handed me a business card with the name and address of Dr Scarlette Shorts. She said that she wants her records transferred to his office. I gave her a release of records to sign and made her aware that [employer] is our copying service and that someone from [employer] comes to our office every Friday and her request will be processed then.

## 2015-01-24 NOTE — Telephone Encounter (Signed)
Noted  

## 2015-01-24 NOTE — Telephone Encounter (Signed)
Reviewed

## 2015-01-25 NOTE — Telephone Encounter (Signed)
noted 

## 2015-01-25 NOTE — Telephone Encounter (Signed)
Noted  

## 2015-01-25 NOTE — Progress Notes (Signed)
See phone note. Pt is transferring records to another GI doctor.

## 2015-02-10 DIAGNOSIS — R194 Change in bowel habit: Secondary | ICD-10-CM | POA: Diagnosis not present

## 2015-02-15 DIAGNOSIS — R194 Change in bowel habit: Secondary | ICD-10-CM | POA: Diagnosis not present

## 2015-02-20 ENCOUNTER — Telehealth: Payer: Self-pay | Admitting: Internal Medicine

## 2015-02-20 NOTE — Telephone Encounter (Signed)
declined

## 2015-02-27 ENCOUNTER — Telehealth: Payer: Self-pay | Admitting: Internal Medicine

## 2015-02-27 NOTE — Telephone Encounter (Signed)
Dr. Margaretann Loveless would like to speak to Dr. Henrene Pastor about this patient. Pt was originally denied as a new patient, and he would like to discuss it. Contact him driectly @ [phone]

## 2015-03-01 NOTE — Telephone Encounter (Signed)
Spoke with Dr. Willey Blade. Explained to him that we're unable to accommodate, as new patients, patients with established gastroenterologists (she has seen 2 other gastroenterologists elsewhere previously) requesting "second opinion" at this time due to the current demands on our practice. He understood and will get back to his patient

## 2015-03-09 DIAGNOSIS — R0989 Other specified symptoms and signs involving the circulatory and respiratory systems: Secondary | ICD-10-CM | POA: Diagnosis not present

## 2015-03-15 DIAGNOSIS — K5730 Diverticulosis of large intestine without perforation or abscess without bleeding: Secondary | ICD-10-CM | POA: Diagnosis not present

## 2015-03-15 DIAGNOSIS — I701 Atherosclerosis of renal artery: Secondary | ICD-10-CM | POA: Diagnosis not present

## 2015-03-22 DIAGNOSIS — H4011X1 Primary open-angle glaucoma, mild stage: Secondary | ICD-10-CM | POA: Diagnosis not present

## 2015-04-19 DIAGNOSIS — D225 Melanocytic nevi of trunk: Secondary | ICD-10-CM | POA: Diagnosis not present

## 2015-04-19 DIAGNOSIS — L908 Other atrophic disorders of skin: Secondary | ICD-10-CM | POA: Diagnosis not present

## 2015-04-19 DIAGNOSIS — X32XXXD Exposure to sunlight, subsequent encounter: Secondary | ICD-10-CM | POA: Diagnosis not present

## 2015-04-19 DIAGNOSIS — L570 Actinic keratosis: Secondary | ICD-10-CM | POA: Diagnosis not present

## 2015-04-24 DIAGNOSIS — Z79899 Other long term (current) drug therapy: Secondary | ICD-10-CM | POA: Diagnosis not present

## 2015-04-24 DIAGNOSIS — E785 Hyperlipidemia, unspecified: Secondary | ICD-10-CM | POA: Diagnosis not present

## 2015-04-24 DIAGNOSIS — I10 Essential (primary) hypertension: Secondary | ICD-10-CM | POA: Diagnosis not present

## 2015-04-24 DIAGNOSIS — M1990 Unspecified osteoarthritis, unspecified site: Secondary | ICD-10-CM | POA: Diagnosis not present

## 2015-05-02 DIAGNOSIS — Z6822 Body mass index (BMI) 22.0-22.9, adult: Secondary | ICD-10-CM | POA: Diagnosis not present

## 2015-05-02 DIAGNOSIS — M1990 Unspecified osteoarthritis, unspecified site: Secondary | ICD-10-CM | POA: Diagnosis not present

## 2015-05-02 DIAGNOSIS — I10 Essential (primary) hypertension: Secondary | ICD-10-CM | POA: Diagnosis not present

## 2015-05-02 DIAGNOSIS — R7301 Impaired fasting glucose: Secondary | ICD-10-CM | POA: Diagnosis not present

## 2015-05-02 DIAGNOSIS — Z23 Encounter for immunization: Secondary | ICD-10-CM | POA: Diagnosis not present

## 2015-06-15 DIAGNOSIS — H353221 Exudative age-related macular degeneration, left eye, with active choroidal neovascularization: Secondary | ICD-10-CM | POA: Diagnosis not present

## 2015-07-19 DIAGNOSIS — H401131 Primary open-angle glaucoma, bilateral, mild stage: Secondary | ICD-10-CM | POA: Diagnosis not present

## 2015-11-02 ENCOUNTER — Ambulatory Visit (INDEPENDENT_AMBULATORY_CARE_PROVIDER_SITE_OTHER): Payer: Commercial Managed Care - HMO | Admitting: Otolaryngology

## 2015-11-02 DIAGNOSIS — K219 Gastro-esophageal reflux disease without esophagitis: Secondary | ICD-10-CM | POA: Diagnosis not present

## 2015-11-02 DIAGNOSIS — R490 Dysphonia: Secondary | ICD-10-CM | POA: Diagnosis not present

## 2015-11-15 DIAGNOSIS — M5137 Other intervertebral disc degeneration, lumbosacral region: Secondary | ICD-10-CM | POA: Diagnosis not present

## 2015-11-15 DIAGNOSIS — Q762 Congenital spondylolisthesis: Secondary | ICD-10-CM | POA: Diagnosis not present

## 2015-11-15 DIAGNOSIS — M4310 Spondylolisthesis, site unspecified: Secondary | ICD-10-CM | POA: Diagnosis not present

## 2015-11-23 DIAGNOSIS — D225 Melanocytic nevi of trunk: Secondary | ICD-10-CM | POA: Diagnosis not present

## 2015-11-23 DIAGNOSIS — X32XXXD Exposure to sunlight, subsequent encounter: Secondary | ICD-10-CM | POA: Diagnosis not present

## 2015-11-23 DIAGNOSIS — L821 Other seborrheic keratosis: Secondary | ICD-10-CM | POA: Diagnosis not present

## 2015-11-23 DIAGNOSIS — L570 Actinic keratosis: Secondary | ICD-10-CM | POA: Diagnosis not present

## 2015-11-28 ENCOUNTER — Ambulatory Visit (HOSPITAL_COMMUNITY): Payer: Commercial Managed Care - HMO

## 2015-12-06 DIAGNOSIS — M5137 Other intervertebral disc degeneration, lumbosacral region: Secondary | ICD-10-CM | POA: Diagnosis not present

## 2015-12-06 DIAGNOSIS — M4310 Spondylolisthesis, site unspecified: Secondary | ICD-10-CM | POA: Diagnosis not present

## 2015-12-07 ENCOUNTER — Ambulatory Visit (HOSPITAL_COMMUNITY): Payer: Commercial Managed Care - HMO | Attending: Orthopaedic Surgery

## 2015-12-07 DIAGNOSIS — M545 Low back pain, unspecified: Secondary | ICD-10-CM

## 2015-12-07 DIAGNOSIS — M6281 Muscle weakness (generalized): Secondary | ICD-10-CM | POA: Diagnosis not present

## 2015-12-07 DIAGNOSIS — M25552 Pain in left hip: Secondary | ICD-10-CM

## 2015-12-07 DIAGNOSIS — R2681 Unsteadiness on feet: Secondary | ICD-10-CM

## 2015-12-08 NOTE — Therapy (Addendum)
[place], [place], [address] Phone: [phone]   Fax:  [phone]  Physical Therapy Evaluation  Patient Details  Name: Jennifer Cruz MRN: [id number] Date of Birth: 11/21/1930 Referring Provider: Joni Fears   Encounter Date: 12/07/2015      PT End of Session - 12/08/15 1232    Visit Number 1   Number of Visits 12   Date for PT Re-Evaluation 12/28/15   Authorization Type Medicare    Authorization - Visit Number 1   Authorization - Number of Visits 12   PT Start Time [id number]   PT Stop Time [id number]   PT Time Calculation (min) 50 min   Activity Tolerance Patient tolerated treatment well;No increased pain   Behavior During Therapy [facility] for tasks assessed/performed      Past Medical History  Diagnosis Date  . Hypertension   . Hyperlipidemia   . Anxiety   . Depression   . Osteoarthritis   . Memory loss     work up negative for Alzheimer's per medical records.  . Pulmonary nodules     per patient work-up completed at [facility] and no further evaluation needed  . Dysrhythmia     palpitations  . Diverticulosis     Past Surgical History  Procedure Laterality Date  . Tonsillectomy    . Total knee arthroplasty  05/2009    right  . Breast enhancement surgery    . Cataract extraction  2009  . Refractive surgery  2010  . Incomplete colonoscopy  2005    inadequate sedation, [facility]  . Colonoscopy with propofol N/A 09/17/2012    Dr.Rourk- normal rectum, pancolonic diverticulosis: o/w the remainder of the colonic mucosa appeared normal. it is notable the left colon was somewhat stiff and noncompliant.     There were no vitals filed for this visit.       Subjective Assessment - 12/07/15 1309    Subjective Pt report she began to have some back pain in the fall of 2016, which she associated with gardening, but she notes that its worse in morning. Pain is about 8/10 upon first waking, but resolves after 15 minutes  of walking and stays about a 2/10, or worse with prolonged sitting.  Pt points to left gluteal region, and attests to occasional central lumbothoracic area. Pt denies burning pain, numbness, tingling. Left foot is also chronically arthritic, but denies burning, nubness or tingling.    Pertinent History R TKA in 2010, Left knee pain, Left foot OA.    How long can you sit comfortably? About 2 hours (long car trips or movie)m resolves with walking.    How long can you stand comfortably? About 2 hours, noted at a funeral 2 weeks ago.    How long can you walk comfortably? No limits, feels great.    Diagnostic tests Imaing of Left knee and Lumbar spine with noted anterolisthesis (Gr2) at L5/S1   Currently in Pain? Yes   Pain Score 1    Pain Orientation Left   Pain Descriptors / Indicators Aching;Sore   Pain Type Chronic pain   Pain Onset More than a month ago   Pain Frequency Several days a week   Aggravating Factors  theater sign    Pain Relieving Factors walking         Strength Testing:  All BLE strength is tested as 5/5 except for the following:  Prone hip extension (HS): 3+/5 bilat  Glute med abduction: 4+/5  bilat  Seated hip IR and ER: 4+/5 bilat   Light Touch Sensation:  Intact in BLE  ROM:   Prone Right Hip IR: 60+ degrees  Prone Right Hip ER: 18 degrees   Prone Left Hip IR: 40 degrees  Prone Left Hip ER: 40 degrees  Prone Hamstrings Length:  R: 145 degrees  L: 140 degrees  Palpation:   1. Peritrochanteric pain bilat  2. Left hip pain near iliac attachement of TLF, and along the ITB length and lateral quads  3. BLE pain in calves with mild edema  4. L medial knee pain (chronic)   5. Left foot pain (chronic)   6. Central low back pain with palpation of soft tissue near spinous process of L4/L5 Special Tests:  1. Left Thigh thrust: negative for hip OA, negative for SI pain, positive for knee medical compartment pain.   2. Left grind Test: Negative for hip OA/articular  joint pain  3. Left Patricks test: negative for hip OA, negative for SI pain, positive for knee medical compartment pain.   Balance:  R-SLS:<2s  L-SLS:<2s         PT Education - 12/08/15 1231    Education provided Yes   Education Details benefit and limittaions of diagnostic imaging in PT practice    Person(s) Educated Patient   Methods Explanation   Comprehension Verbalized understanding          PT Short Term Goals - 12/08/15 1247    PT SHORT TERM GOAL #1   Title After 2 weeks pt will demonstrat indep in HEP.    PT SHORT TERM GOAL #2   Title After three weeks, pt will demonstrate 4/5 strength in BLE.    PT SHORT TERM GOAL #3   Title After three weeks, pt will demonstrate good education about avoidance of prolonged lordosis, and self management of symptoms.            PT Long Term Goals - 12/08/15 1248    PT LONG TERM GOAL #1   Title After six weeks pt will demonstrate indep in advanced HEP for DC.    PT LONG TERM GOAL #2   Title After 6 weeks, pt will demonstrate greater 4/5 strength in BLE to improve functional strength during leisure/work activities.    PT LONG TERM GOAL #3   Title After 6 weeks, pt will demonstrate SLS >30s bilat to decrease risk of falls at home/community.                Plan - 12/08/15 1234    Clinical Impression Statement Safiyya Kosmalski is an 82yo white female who c/o insidious onset L posterior gluteal pain. The patient demonstrates impaired strength, especailly in hip extensors and rotators, impaired joint mobility, characterized by stiffness each morning, impaired balance, wherein she is unable to perform SLS balance, fluid retention in BLE beloew the knees with pain to palpation of the legs, and soreness/tenderness to palpation around the central lower lumbar spine, all of which are limiting her activity toleranace, leisure activities, and ability to perform advanced IADL and housework.    Rehab Potential Good   Clinical Impairments  Affecting Rehab Potential L5/S1 spondylolisthesis Grade 2;    PT Frequency 2x / week   PT Duration 6 weeks   PT Treatment/Interventions ADLs/Self Care Home Management;Therapeutic exercise;Therapeutic activities;Balance training;Manual techniques;Patient/family education   PT Next Visit Plan Review Double knee to chest; seated knee flexion on stool. Quadruped hip extension, core stabilization c biofeedback cuff (ab set,  heel slide, knee raise), progress balance.    PT Home Exercise Plan 2 pillows between knees at night, double knee to chhestt repeated flexion in supine.    Recommended Other Services Avoid activities which cause end range lumbar lordosis.    Consulted and Agree with Plan of Care Patient      Patient will benefit from skilled therapeutic intervention in order to improve the following deficits and impairments:  Abnormal gait, Decreased activity tolerance, Decreased balance, Decreased strength, Increased edema, Hypomobility, Decreased skin integrity, Pain  Visit Diagnosis: Left-sided low back pain without sciatica - Plan: PT plan of care cert/re-cert  Pain in left hip - Plan: PT plan of care cert/re-cert  Muscle weakness (generalized) - Plan: PT plan of care cert/re-cert  Unsteadiness on feet - Plan: PT plan of care cert/re-cert     Problem List Patient Active Problem List   Diagnosis Date Noted  . Change in stool caliber 01/05/2015  . Encounter for screening colonoscopy 04/30/2012  . RLQ fullness 04/30/2012  . PALPITATIONS 02/23/2009  . VISUAL IMPAIRMENT 05/11/2008  . OSTEOARTHRITIS, KNEES, BILATERAL 02/18/2008  . ELEVATED BLOOD PRESSURE WITHOUT DIAGNOSIS OF HYPERTENSION 11/17/2007  . MEMORY LOSS 08/17/2007  . MRI, BRAIN, ABNORMAL 08/17/2007  . OSTEOPENIA 07/08/2007  . ANXIETY DEPRESSION 05/08/2007  . HYPERLIPIDEMIA 04/24/2007  . DECREASED HEARING 04/24/2007  . PULMONARY NODULE 04/24/2007    1:04 PM, 12/08/2015 Etta Grandchild, PT, DPT PRN Physical Therapist  - [place] License # [id number] [phone] (mobile)   [facility] [address] [place], [place], [address] Phone: [phone]   Fax:  [phone]  Name: AIESHA CONSTANTINEAU MRN: [id number] Date of Birth: 1930/08/21   *Addendeum made to add Evaluation G codes based on data from evaluation visit. 1:54 PM, 01/22/2016 Etta Grandchild, PT, DPT Physical Therapist at [place] [phone] (office)

## 2015-12-12 ENCOUNTER — Ambulatory Visit (HOSPITAL_COMMUNITY): Payer: Commercial Managed Care - HMO | Admitting: Physical Therapy

## 2015-12-12 DIAGNOSIS — M6281 Muscle weakness (generalized): Secondary | ICD-10-CM

## 2015-12-12 DIAGNOSIS — M25552 Pain in left hip: Secondary | ICD-10-CM | POA: Diagnosis not present

## 2015-12-12 DIAGNOSIS — R2681 Unsteadiness on feet: Secondary | ICD-10-CM

## 2015-12-12 DIAGNOSIS — M545 Low back pain, unspecified: Secondary | ICD-10-CM

## 2015-12-12 NOTE — Patient Instructions (Signed)
Piriformis Stretch, Sitting    Sit, one ankle on opposite knee, same-side hand on crossed knee. Push down on knee, keeping spine straight. Lean torso forward, with flat back, until tension is felt in hamstrings and gluteals of crossed-leg side. Hold _30__ seconds.  Repeat __2_ times per session. Do __2_ sessions per day.   Hamstring Stretch (Standing)    Standing, place one heel on chair or bench. Use one or both hands on thigh for support. Keeping torso straight, lean forward slowly until a stretch is felt in back of same thigh. Hold _30___ seconds. Repeat with other leg.     With other leg bent, foot flat, grasp right leg and slowly try to straighten knee. Hold _30___ seconds. Repeat _2___ times. Do ___2_ sessions per day.   KNEE: Quadriceps - Prone    Place strap around ankle. Bring ankle toward buttocks. Press hip into surface. Hold _30__ seconds. __2_ reps per set, _2__ sets per day   HIP / KNEE: Extension - Prone    Squeeze glutes. Raise leg up. Keep knee straight. _10__ reps per set, _2__ sets per day   Abduction: Side Leg Lift (Eccentric) - Side-Lying    Lie on side. Lift top leg slightly higher than shoulder level. Keep top leg straight with body, toes pointing forward. Slowly lower for 3-5 seconds. _10__ reps per set, _2__ sets per day week.    Straight Leg Raise    Tighten stomach and slowly raise locked right leg _15___ inches from floor. Repeat __10__ times per set.  Do __2__ sessions per day.    Bridge [employer] small of back into mat, maintain pelvic tilt, roll up one vertebrae at a time. Focus on engaging posterior hip muscles. Hold for __3__ sec. Repeat _10___ times.

## 2015-12-12 NOTE — Therapy (Signed)
[facility] [address] [place], [place], [address] Phone: [phone]   Fax:  [phone]  Physical Therapy Treatment  Patient Details  Name: Jennifer Cruz MRN: [id number] Date of Birth: May 09, 1931 Referring Provider: Joni Fears   Encounter Date: 12/12/2015      PT End of Session - 12/12/15 0936    Visit Number 2   Number of Visits 12   Date for PT Re-Evaluation 12/28/15   Authorization Type Medicare    Authorization - Visit Number 2   Authorization - Number of Visits 12   PT Start Time 0815   PT Stop Time [id number]   PT Time Calculation (min) 40 min   Activity Tolerance Patient tolerated treatment well;No increased pain   Behavior During Therapy [facility] for tasks assessed/performed      Past Medical History  Diagnosis Date  . Hypertension   . Hyperlipidemia   . Anxiety   . Depression   . Osteoarthritis   . Memory loss     work up negative for Alzheimer's per medical records.  . Pulmonary nodules     per patient work-up completed at [facility] and no further evaluation needed  . Dysrhythmia     palpitations  . Diverticulosis     Past Surgical History  Procedure Laterality Date  . Tonsillectomy    . Total knee arthroplasty  05/2009    right  . Breast enhancement surgery    . Cataract extraction  2009  . Refractive surgery  2010  . Incomplete colonoscopy  2005    inadequate sedation, [facility]  . Colonoscopy with propofol N/A 09/17/2012    Dr.Rourk- normal rectum, pancolonic diverticulosis: o/w the remainder of the colonic mucosa appeared normal. it is notable the left colon was somewhat stiff and noncompliant.     There were no vitals filed for this visit.      Subjective Assessment - 12/12/15 0821    Subjective Pt states she has more pain when she first gets up into her Lt foot and hip and gets better as she begins walking around.  Currently 2/10   Currently in Pain? Yes   Pain Score 2    Pain Location Foot   Pain  Orientation Left   Pain Descriptors / Indicators Constant;Aching   Pain Type Chronic pain                         [facility] Adult PT Treatment/Exercise - 12/12/15 0942    Knee/Hip Exercises: Stretches   Active Hamstring Stretch Both;2 reps;30 seconds   Active Hamstring Stretch Limitations supine with rope   Quad Stretch Both;2 reps;30 seconds   Quad Stretch Limitations prone with rope   Piriformis Stretch Both;30 seconds   Piriformis Stretch Limitations seated   Knee/Hip Exercises: Supine   Bridges 10 reps   Straight Leg Raises Both;10 reps   Knee/Hip Exercises: Sidelying   Hip ABduction Both;10 reps   Knee/Hip Exercises: Prone   Hip Extension Both;10 reps                PT Education - 12/12/15 0936    Education provided Yes   Education Details reviewed HEP and initial evaluation.  Updated HEP and given information on compression hose outlet in [place].  educated on edema and venous insufficiency and benefits of compression hose.   Person(s) Educated Patient   Methods Explanation;Demonstration;Tactile cues;Verbal cues;Handout   Comprehension Verbalized understanding;Returned demonstration;Verbal cues required;Tactile cues required;Need further  instruction          PT Short Term Goals - 12/08/15 1247    PT SHORT TERM GOAL #1   Title After 2 weeks pt will demonstrat indep in HEP.    PT SHORT TERM GOAL #2   Title After three weeks, pt will demonstrate 4/5 strength in BLE.    PT SHORT TERM GOAL #3   Title After three weeks, pt will demonstrate good education about avoidance of prolonged lordosis, and self management of symptoms.            PT Long Term Goals - 12/08/15 1248    PT LONG TERM GOAL #1   Title After six weeks pt will demonstrate indep in advanced HEP for DC.    PT LONG TERM GOAL #2   Title After 6 weeks, pt will demonstrate greater 4/5 strength in BLE to improve functional strength during leisure/work activities.    PT LONG TERM GOAL  #3   Title After 6 weeks, pt will demonstrate SLS >30s bilat to decrease risk of falls at home/community.                Plan - 12/12/15 0938    Clinical Impression Statement eviewed HEP and initial evaluation.  Updated HEP and given information on compression hose outlet in [place].  educated on edema and venous insufficiency and benefits of compression hose.  Noted weakness in glutes and hip abductors; tightness in hamstrings and quads.  Pt required multimodal cues to complete all therex correctly and isolate correct musculature.     Rehab Potential Good   PT Frequency 2x / week   PT Duration 6 weeks   PT Treatment/Interventions ADLs/Self Care Home Management;Therapeutic exercise;Therapeutic activities;Balance training;Manual techniques;Patient/family education   PT Next Visit Plan Review newly added therex given for HEP (as below), progress with standing therex including squats, lunges, SLS and tandem stance.  Progress to dynamic balance actvities.    PT Home Exercise Plan updated with hamstring/piriformis/quad stretches, SLR, bridge, hip extension/abduction   Consulted and Agree with Plan of Care Patient      Patient will benefit from skilled therapeutic intervention in order to improve the following deficits and impairments:  Abnormal gait, Decreased activity tolerance, Decreased balance, Decreased strength, Increased edema, Hypomobility, Decreased skin integrity, Pain  Visit Diagnosis: Left-sided low back pain without sciatica  Pain in left hip  Muscle weakness (generalized)  Unsteadiness on feet     Problem List Patient Active Problem List   Diagnosis Date Noted  . Change in stool caliber 01/05/2015  . Encounter for screening colonoscopy 04/30/2012  . RLQ fullness 04/30/2012  . PALPITATIONS 02/23/2009  . VISUAL IMPAIRMENT 05/11/2008  . OSTEOARTHRITIS, KNEES, BILATERAL 02/18/2008  . ELEVATED BLOOD PRESSURE WITHOUT DIAGNOSIS OF HYPERTENSION 11/17/2007  . MEMORY  LOSS 08/17/2007  . MRI, BRAIN, ABNORMAL 08/17/2007  . OSTEOPENIA 07/08/2007  . ANXIETY DEPRESSION 05/08/2007  . HYPERLIPIDEMIA 04/24/2007  . DECREASED HEARING 04/24/2007  . PULMONARY NODULE 04/24/2007    Teena Irani, PTA/CLT [phone]  12/12/2015, 9:44 AM  [facility] [address] [place], [place], [address] Phone: [phone]   Fax:  [phone]  Name: Jennifer Cruz MRN: [id number] Date of Birth: 1931-07-17

## 2015-12-14 ENCOUNTER — Ambulatory Visit (HOSPITAL_COMMUNITY): Payer: Commercial Managed Care - HMO | Admitting: Physical Therapy

## 2015-12-14 DIAGNOSIS — M6281 Muscle weakness (generalized): Secondary | ICD-10-CM

## 2015-12-14 DIAGNOSIS — M545 Low back pain, unspecified: Secondary | ICD-10-CM

## 2015-12-14 DIAGNOSIS — M25552 Pain in left hip: Secondary | ICD-10-CM

## 2015-12-14 DIAGNOSIS — R2681 Unsteadiness on feet: Secondary | ICD-10-CM | POA: Diagnosis not present

## 2015-12-14 NOTE — Therapy (Signed)
[facility] [address] [place], [place], [address] Phone: [phone]   Fax:  [phone]  Physical Therapy Treatment  Patient Details  Name: Jennifer Cruz MRN: [id number] Date of Birth: 02/01/1931 Referring Provider: Joni Fears   Encounter Date: 12/14/2015      PT End of Session - 12/14/15 0932    Visit Number 3   Number of Visits 12   Date for PT Re-Evaluation 12/28/15   Authorization Type Medicare    Authorization - Visit Number 3   Authorization - Number of Visits 12   PT Start Time 0818   PT Stop Time 0908   PT Time Calculation (min) 50 min   Activity Tolerance Patient tolerated treatment well;No increased pain   Behavior During Therapy [facility] for tasks assessed/performed      Past Medical History  Diagnosis Date  . Hypertension   . Hyperlipidemia   . Anxiety   . Depression   . Osteoarthritis   . Memory loss     work up negative for Alzheimer's per medical records.  . Pulmonary nodules     per patient work-up completed at [facility] and no further evaluation needed  . Dysrhythmia     palpitations  . Diverticulosis     Past Surgical History  Procedure Laterality Date  . Tonsillectomy    . Total knee arthroplasty  05/2009    right  . Breast enhancement surgery    . Cataract extraction  2009  . Refractive surgery  2010  . Incomplete colonoscopy  2005    inadequate sedation, [facility]  . Colonoscopy with propofol N/A 09/17/2012    Dr.Rourk- normal rectum, pancolonic diverticulosis: o/w the remainder of the colonic mucosa appeared normal. it is notable the left colon was somewhat stiff and noncompliant.     There were no vitals filed for this visit.      Subjective Assessment - 12/14/15 0852    Subjective Pt states she was elated to wake yesterday morning and be painfree.  States she did not have pain all day and worked in her yard/garden all day.  States she woke this morning with increased pain of 7/10 in Lt hip  and foot   Currently in Pain? Yes   Pain Score 7    Pain Location Foot   Pain Orientation Left   Pain Descriptors / Indicators Constant;Aching   Pain Type Chronic pain                         [facility] Adult PT Treatment/Exercise - 12/14/15 0001    Knee/Hip Exercises: Stretches   Active Hamstring Stretch Both;2 reps;30 seconds   Active Hamstring Stretch Limitations supine with rope   Piriformis Stretch Both;30 seconds   Piriformis Stretch Limitations seated   Knee/Hip Exercises: Aerobic   Nustep 6 minutes level 2 hills 2 seat and UE's at 8   Knee/Hip Exercises: Standing   Heel Raises 10 reps   Heel Raises Limitations toeraises 10 reps   Forward Lunges Both;10 reps   Forward Lunges Limitations 4" step intermittent UE assist   Functional Squat 10 reps   SLS max 3 no UE's Rt: 10", Lt: 8"   Other Standing Knee Exercises tandem gait 30" X 2 each    Knee/Hip Exercises: Supine   Bridges 10 reps   Straight Leg Raises Both;10 reps   Knee/Hip Exercises: Sidelying   Hip ABduction Both;10 reps   Knee/Hip Exercises: Prone  Hip Extension Both;10 reps                  PT Short Term Goals - 12/08/15 1247    PT SHORT TERM GOAL #1   Title After 2 weeks pt will demonstrat indep in HEP.    PT SHORT TERM GOAL #2   Title After three weeks, pt will demonstrate 4/5 strength in BLE.    PT SHORT TERM GOAL #3   Title After three weeks, pt will demonstrate good education about avoidance of prolonged lordosis, and self management of symptoms.            PT Long Term Goals - 12/08/15 1248    PT LONG TERM GOAL #1   Title After six weeks pt will demonstrate indep in advanced HEP for DC.    PT LONG TERM GOAL #2   Title After 6 weeks, pt will demonstrate greater 4/5 strength in BLE to improve functional strength during leisure/work activities.    PT LONG TERM GOAL #3   Title After 6 weeks, pt will demonstrate SLS >30s bilat to decrease risk of falls at home/community.                 Plan - 12/14/15 0933    Clinical Impression Statement Reviewed HEP with patient able to recall 2/5 correctly and needed multimodal cues for all to complete correctly.  Pt presents with extreme weakness with hip abduction and extension exercises.  Progressed to static balance and standing exercises this session.  Pt without c/o pain in knees when completed in correct form.  Finished session on nustep to introduce aerobic actvity for increaseing actvity tolerance.  Pt reports interest in going to senior center to complete some machines.     Rehab Potential Good   PT Frequency 2x / week   PT Duration 6 weeks   PT Treatment/Interventions ADLs/Self Care Home Management;Therapeutic exercise;Therapeutic activities;Balance training;Manual techniques;Patient/family education   PT Next Visit Plan Review HEP once again for correctness and recall.  continue to progress dynamic balance actvities.     PT Home Exercise Plan updated with hamstring/piriformis/quad stretches, SLR, bridge, hip extension/abduction   Consulted and Agree with Plan of Care Patient      Patient will benefit from skilled therapeutic intervention in order to improve the following deficits and impairments:  Abnormal gait, Decreased activity tolerance, Decreased balance, Decreased strength, Increased edema, Hypomobility, Decreased skin integrity, Pain  Visit Diagnosis: Left-sided low back pain without sciatica  Pain in left hip  Muscle weakness (generalized)  Unsteadiness on feet     Problem List Patient Active Problem List   Diagnosis Date Noted  . Change in stool caliber 01/05/2015  . Encounter for screening colonoscopy 04/30/2012  . RLQ fullness 04/30/2012  . PALPITATIONS 02/23/2009  . VISUAL IMPAIRMENT 05/11/2008  . OSTEOARTHRITIS, KNEES, BILATERAL 02/18/2008  . ELEVATED BLOOD PRESSURE WITHOUT DIAGNOSIS OF HYPERTENSION 11/17/2007  . MEMORY LOSS 08/17/2007  . MRI, BRAIN, ABNORMAL 08/17/2007  .  OSTEOPENIA 07/08/2007  . ANXIETY DEPRESSION 05/08/2007  . HYPERLIPIDEMIA 04/24/2007  . DECREASED HEARING 04/24/2007  . PULMONARY NODULE 04/24/2007   Teena Irani, PTA/CLT [phone]  12/14/2015, 9:39 AM  [facility] [address] [place], [place], [address] Phone: [phone]   Fax:  [phone]  Name: Jennifer Cruz MRN: [id number] Date of Birth: August 15, 1930

## 2015-12-19 ENCOUNTER — Encounter (HOSPITAL_COMMUNITY): Payer: Self-pay

## 2015-12-19 ENCOUNTER — Ambulatory Visit (HOSPITAL_COMMUNITY): Payer: Commercial Managed Care - HMO

## 2015-12-19 DIAGNOSIS — M6281 Muscle weakness (generalized): Secondary | ICD-10-CM | POA: Diagnosis not present

## 2015-12-19 DIAGNOSIS — R2681 Unsteadiness on feet: Secondary | ICD-10-CM

## 2015-12-19 DIAGNOSIS — M545 Low back pain, unspecified: Secondary | ICD-10-CM

## 2015-12-19 DIAGNOSIS — M25552 Pain in left hip: Secondary | ICD-10-CM

## 2015-12-19 NOTE — Therapy (Signed)
[facility] [address] [place], [place], [address] Phone: [phone]   Fax:  [phone]  Physical Therapy Treatment  Patient Details  Name: Jennifer Cruz MRN: [id number] Date of Birth: 13-Sep-1930 Referring Provider: Joni Fears   Encounter Date: 12/19/2015      PT End of Session - 12/19/15 1115    Visit Number 4   Number of Visits 12   Date for PT Re-Evaluation 12/28/15   Authorization Type Medicare    Authorization - Visit Number 4   Authorization - Number of Visits 12   PT Start Time [id number]   PT Stop Time 1116   PT Time Calculation (min) 45 min   Activity Tolerance Patient tolerated treatment well;No increased pain   Behavior During Therapy [facility] for tasks assessed/performed      Past Medical History  Diagnosis Date  . Hypertension   . Hyperlipidemia   . Anxiety   . Depression   . Osteoarthritis   . Memory loss     work up negative for Alzheimer's per medical records.  . Pulmonary nodules     per patient work-up completed at [facility] and no further evaluation needed  . Dysrhythmia     palpitations  . Diverticulosis     Past Surgical History  Procedure Laterality Date  . Tonsillectomy    . Total knee arthroplasty  05/2009    right  . Breast enhancement surgery    . Cataract extraction  2009  . Refractive surgery  2010  . Incomplete colonoscopy  2005    inadequate sedation, [facility]  . Colonoscopy with propofol N/A 09/17/2012    Dr.Rourk- normal rectum, pancolonic diverticulosis: o/w the remainder of the colonic mucosa appeared normal. it is notable the left colon was somewhat stiff and noncompliant.     There were no vitals filed for this visit.      Subjective Assessment - 12/19/15 1036    Subjective Pt reports she has been remaining active, gardening more. Pt reports she has had some pain free days, but overall feels like she is gaining some ground on her strength.    Pertinent History R TKA in 2010, Left  knee pain, Left foot OA.    Currently in Pain? No/denies                         [facility] Adult PT Treatment/Exercise - 12/19/15 0001    Exercises   Exercises Lumbar   Lumbar Exercises: Stretches   Double Knee to Chest Stretch --  (HEP addition) 1X20 FOR SELF MOBILIZATION   Piriformis Stretch Limitations not tolerated will avoid in future sessions  likely related to femoral torsion and irritability of glute    Lumbar Exercises: Supine   Other Supine Lumbar Exercises Alternating bent knee drop  (HEP addition) 2x10 in supine for core activation   Knee/Hip Exercises: Standing   Heel Raises 20 reps;2 sets   Heel Raises Limitations toeraises 10 reps  2X10   Functional Squat 10 reps;2 sets  CHAIR FOR FEEDBACK, raised surface, painful in L knee   SLS alteranting, 2x10x5 seconds (HEP addition)   BUE support required, uses clock for time.   Knee/Hip Exercises: Seated   Long Arc Quad Left;2 sets;15 reps;Weights  5# (HEP addition, but without weights at home)    Knee/Hip Exercises: Supine   Bridges 10 reps;2 sets   Other Supine Knee/Hip Exercises Supine RTB clamshell (HEP addition)   2x10, left side  is weak, requires focus to keep symetrical   Other Supine Knee/Hip Exercises supine hip abduction  2x10, heel slides   Knee/Hip Exercises: Sidelying   Hip ABduction --  performed bilat in supine                PT Education - 12/19/15 1114    Education provided Yes   Education Details Explained how low back imaging may be limited in giving useful information about funciton. Asked to consider a trip to the hose outlet.    Person(s) Educated Patient   Methods Explanation;Demonstration;Tactile cues   Comprehension Verbalized understanding;Returned demonstration          PT Short Term Goals - 12/08/15 1247    PT SHORT TERM GOAL #1   Title After 2 weeks pt will demonstrat indep in HEP.    PT SHORT TERM GOAL #2   Title After three weeks, pt will demonstrate 4/5  strength in BLE.    PT SHORT TERM GOAL #3   Title After three weeks, pt will demonstrate good education about avoidance of prolonged lordosis, and self management of symptoms.            PT Long Term Goals - 12/08/15 1248    PT LONG TERM GOAL #1   Title After six weeks pt will demonstrate indep in advanced HEP for DC.    PT LONG TERM GOAL #2   Title After 6 weeks, pt will demonstrate greater 4/5 strength in BLE to improve functional strength during leisure/work activities.    PT LONG TERM GOAL #3   Title After 6 weeks, pt will demonstrate SLS >30s bilat to decrease risk of falls at home/community.                Plan - 12/19/15 1121    Clinical Impression Statement Pt making progress toward goals this week as evidence dby imrproved tolerance to exercises, and improved function at home. Pt responded to progression toward higher level of function-based core strengthening, as well as higher level hip strengthening. The patient needed heavy verbal cues to stay on task today, not counting consistently and sometimes confusing instructions for tecnique halfway through the activity. Returning to a second set of an exercises after performing one intermittently is sometmes confusing and requires reitteration of instructions.  Pt able to perform LAQ weighted without complaint, but funcitonal squats requires additional cues adn are pain provoking in the Left knee, also deomonstrate decreased LLE weight bearing.    Rehab Potential Good   Clinical Impairments Affecting Rehab Potential L5/S1 spondylolisthesis Grade 2;    PT Frequency 2x / week   PT Duration 6 weeks   PT Treatment/Interventions ADLs/Self Care Home Management;Therapeutic exercise;Therapeutic activities;Balance training;Manual techniques;Patient/family education   PT Next Visit Plan Review recommendations for compression hose; review new HEP activities. Progress weight on LAQ to 7#.    PT Home Exercise Plan Pt reports no new updates  since evaluation, however most recent treatment note reports 'updated with hamstring/piriformis/quad stretches, SLR, bridge, hip extension/abduction.' Being unaware of these additions, I added 4 new exercises for home. with print-out.    Recommended Other Services Avoid activities that involve end-range lumbar lordosis, or repeated lumbar extension. Pt has a grade-2 spondylolisthesis.    Consulted and Agree with Plan of Care Patient      Patient will benefit from skilled therapeutic intervention in order to improve the following deficits and impairments:  Abnormal gait, Decreased activity tolerance, Decreased balance, Decreased strength, Increased edema, Hypomobility, Decreased  skin integrity, Pain  Visit Diagnosis: Left-sided low back pain without sciatica  Pain in left hip  Muscle weakness (generalized)  Unsteadiness on feet     Problem List Patient Active Problem List   Diagnosis Date Noted  . Change in stool caliber 01/05/2015  . Encounter for screening colonoscopy 04/30/2012  . RLQ fullness 04/30/2012  . PALPITATIONS 02/23/2009  . VISUAL IMPAIRMENT 05/11/2008  . OSTEOARTHRITIS, KNEES, BILATERAL 02/18/2008  . ELEVATED BLOOD PRESSURE WITHOUT DIAGNOSIS OF HYPERTENSION 11/17/2007  . MEMORY LOSS 08/17/2007  . MRI, BRAIN, ABNORMAL 08/17/2007  . OSTEOPENIA 07/08/2007  . ANXIETY DEPRESSION 05/08/2007  . HYPERLIPIDEMIA 04/24/2007  . DECREASED HEARING 04/24/2007  . PULMONARY NODULE 04/24/2007    11:34 AM, 12/19/2015 Etta Grandchild, PT, DPT PRN Physical Therapist at [place] # [id number] [phone] (office)      [facility] [address] [place], [place], [address] Phone: [phone]   Fax:  [phone]  Name: KERRAH WINDHOLZ MRN: [id number] Date of Birth: August 17, 1930

## 2015-12-21 ENCOUNTER — Ambulatory Visit (HOSPITAL_COMMUNITY): Payer: Commercial Managed Care - HMO

## 2015-12-21 DIAGNOSIS — M25552 Pain in left hip: Secondary | ICD-10-CM | POA: Diagnosis not present

## 2015-12-21 DIAGNOSIS — M545 Low back pain, unspecified: Secondary | ICD-10-CM

## 2015-12-21 DIAGNOSIS — M6281 Muscle weakness (generalized): Secondary | ICD-10-CM | POA: Diagnosis not present

## 2015-12-21 DIAGNOSIS — R2681 Unsteadiness on feet: Secondary | ICD-10-CM | POA: Diagnosis not present

## 2015-12-21 NOTE — Therapy (Signed)
[facility] [address] [place], [place], [address] Phone: [phone]   Fax:  [phone]  Physical Therapy Treatment  Patient Details  Name: Jennifer Cruz MRN: [id number] Date of Birth: 1931/05/30 Referring Provider: Joni Fears   Encounter Date: 12/21/2015      PT End of Session - 12/21/15 0833    Visit Number 5   Number of Visits 12   Date for PT Re-Evaluation 12/28/15   Authorization Type Medicare    Authorization - Visit Number 5   Authorization - Number of Visits 12   PT Start Time [id number]   PT Stop Time [id number]   PT Time Calculation (min) 38 min   Activity Tolerance Patient tolerated treatment well;No increased pain   Behavior During Therapy [facility] for tasks assessed/performed      Past Medical History  Diagnosis Date  . Hypertension   . Hyperlipidemia   . Anxiety   . Depression   . Osteoarthritis   . Memory loss     work up negative for Alzheimer's per medical records.  . Pulmonary nodules     per patient work-up completed at [facility] and no further evaluation needed  . Dysrhythmia     palpitations  . Diverticulosis     Past Surgical History  Procedure Laterality Date  . Tonsillectomy    . Total knee arthroplasty  05/2009    right  . Breast enhancement surgery    . Cataract extraction  2009  . Refractive surgery  2010  . Incomplete colonoscopy  2005    inadequate sedation, [facility]  . Colonoscopy with propofol N/A 09/17/2012    Dr.Rourk- normal rectum, pancolonic diverticulosis: o/w the remainder of the colonic mucosa appeared normal. it is notable the left colon was somewhat stiff and noncompliant.     There were no vitals filed for this visit.      Subjective Assessment - 12/21/15 0817    Subjective Pt reports she is doing ok today. Feels much better overall, only a little stiff in the morning. She still has not started working on her HEP, including new items givenlast session, but reports she will try her  best to start them soon.    Pertinent History R TKA in 2010, Left knee pain, Left foot OA.    Currently in Pain? Yes   Pain Score 4    Pain Location --  Left knee, left foot   Pain Orientation Left   Pain Descriptors / Indicators Aching   Pain Type Chronic pain                         [facility] Adult PT Treatment/Exercise - 12/21/15 0001    Lumbar Exercises: Stretches   Double Knee to Chest Stretch 1 rep;60 seconds  rocking for lumbar AAROM   Lumbar Exercises: Supine   Straight Leg Raise 20 reps  2x10 bilat, contralat knee bent   Other Supine Lumbar Exercises Alternating bent knee drop  2x10 in supine for core activation   Knee/Hip Exercises: Standing   Heel Raises 2 sets  2x25, narrow stance   Heel Raises Limitations toeraises 10 reps  2X10   Functional Squat 10 reps;2 sets  6-inches above table; chair to prevent knees past toes.   SLS alteranting, 2x10x5 seconds   BUE support required, uses clock for time.   Knee/Hip Exercises: Seated   Long Arc Quad Left;2 sets;Weights;10 reps  7#   Knee/Hip Exercises: Supine  Short Arc Engineer, materials sets;10 reps  7.5#   Bridges 10 reps;2 sets   Straight Leg Raises --  * lumbar section   Other Supine Knee/Hip Exercises Supine RTB clamshell (HEP addition)   2x10, R side stabilizing, left side moving.   Other Supine Knee/Hip Exercises supine hip abduction  2x10, heel slides   Knee/Hip Exercises: Sidelying   Hip ABduction --  *performed in supine to avoid TFL dominance.                   PT Short Term Goals - 12/08/15 1247    PT SHORT TERM GOAL #1   Title After 2 weeks pt will demonstrat indep in HEP.    PT SHORT TERM GOAL #2   Title After three weeks, pt will demonstrate 4/5 strength in BLE.    PT SHORT TERM GOAL #3   Title After three weeks, pt will demonstrate good education about avoidance of prolonged lordosis, and self management of symptoms.            PT Long Term Goals - 12/08/15 1248     PT LONG TERM GOAL #1   Title After six weeks pt will demonstrate indep in advanced HEP for DC.    PT LONG TERM GOAL #2   Title After 6 weeks, pt will demonstrate greater 4/5 strength in BLE to improve functional strength during leisure/work activities.    PT LONG TERM GOAL #3   Title After 6 weeks, pt will demonstrate SLS >30s bilat to decrease risk of falls at home/community.                Plan - 12/21/15 0835    Clinical Impression Statement Pt responding well to treatment today, making progress toward all goals. She was able to progress her therex ain sets and reps and energy level remains constant. Continuing with POC as previously laid out. Pt continues to c/o of chronic Left foot arthritic pain and left knee pain, butt they improve with activity. Habitual reduction of LLE weightbear due to knee is heavily ingrained and willbe difficulty to correct. The patient lacks end-range extention in knee due to 20year OA.    Rehab Potential Good   Clinical Impairments Affecting Rehab Potential L5/S1 spondylolisthesis Grade 2;    PT Frequency 2x / week   PT Duration 6 weeks   PT Treatment/Interventions ADLs/Self Care Home Management;Therapeutic exercise;Therapeutic activities;Balance training;Manual techniques;Patient/family education   PT Next Visit Plan Continue to promote HEP compliance and a trip to [facility] next week to get fitted for compression hose.    PT Home Exercise Plan No updates today, needs better compliance.    Consulted and Agree with Plan of Care Patient      Patient will benefit from skilled therapeutic intervention in order to improve the following deficits and impairments:  Abnormal gait, Decreased activity tolerance, Decreased balance, Decreased strength, Increased edema, Hypomobility, Decreased skin integrity, Pain  Visit Diagnosis: Left-sided low back pain without sciatica  Pain in left hip  Muscle weakness (generalized)  Unsteadiness on  feet     Problem List Patient Active Problem List   Diagnosis Date Noted  . Change in stool caliber 01/05/2015  . Encounter for screening colonoscopy 04/30/2012  . RLQ fullness 04/30/2012  . PALPITATIONS 02/23/2009  . VISUAL IMPAIRMENT 05/11/2008  . OSTEOARTHRITIS, KNEES, BILATERAL 02/18/2008  . ELEVATED BLOOD PRESSURE WITHOUT DIAGNOSIS OF HYPERTENSION 11/17/2007  . MEMORY LOSS 08/17/2007  . MRI, BRAIN, ABNORMAL 08/17/2007  . OSTEOPENIA  07/08/2007  . ANXIETY DEPRESSION 05/08/2007  . HYPERLIPIDEMIA 04/24/2007  . DECREASED HEARING 04/24/2007  . PULMONARY NODULE 04/24/2007   8:54 AM, 12/21/2015 Etta Grandchild, PT, DPT PRN Physical Therapist at [place] License # [id number] [phone] (office)    [facility] [address] [place], [place], [address] Phone: [phone]   Fax:  [phone]  Name: Jennifer Cruz MRN: [id number] Date of Birth: [birth year]

## 2015-12-26 ENCOUNTER — Ambulatory Visit (HOSPITAL_COMMUNITY): Payer: Commercial Managed Care - HMO | Admitting: Physical Therapy

## 2015-12-26 DIAGNOSIS — R2681 Unsteadiness on feet: Secondary | ICD-10-CM | POA: Diagnosis not present

## 2015-12-26 DIAGNOSIS — M545 Low back pain, unspecified: Secondary | ICD-10-CM

## 2015-12-26 DIAGNOSIS — M25552 Pain in left hip: Secondary | ICD-10-CM | POA: Diagnosis not present

## 2015-12-26 DIAGNOSIS — M6281 Muscle weakness (generalized): Secondary | ICD-10-CM

## 2015-12-26 NOTE — Therapy (Signed)
[facility] [address] [place], [place], [address] Phone: [phone]   Fax:  [phone]  Physical Therapy Treatment (Re-Assessment)  Patient Details  Name: Jennifer Cruz MRN: [id number] Date of Birth: February 13, 1931 Referring Provider: Joni Fears   Encounter Date: 12/26/2015      PT End of Session - 12/26/15 1122    Visit Number 6   Number of Visits 12   Date for PT Re-Evaluation 01/18/16   Authorization Type Medicare (G-codes done 6th visit)   Authorization - Visit Number 6   Authorization - Number of Visits 16   PT Start Time 1033   PT Stop Time 1114   PT Time Calculation (min) 41 min   Activity Tolerance Patient tolerated treatment well   Behavior During Therapy [facility] for tasks assessed/performed      Past Medical History  Diagnosis Date  . Hypertension   . Hyperlipidemia   . Anxiety   . Depression   . Osteoarthritis   . Memory loss     work up negative for Alzheimer's per medical records.  . Pulmonary nodules     per patient work-up completed at [facility] and no further evaluation needed  . Dysrhythmia     palpitations  . Diverticulosis     Past Surgical History  Procedure Laterality Date  . Tonsillectomy    . Total knee arthroplasty  05/2009    right  . Breast enhancement surgery    . Cataract extraction  2009  . Refractive surgery  2010  . Incomplete colonoscopy  2005    inadequate sedation, [facility]  . Colonoscopy with propofol N/A 09/17/2012    Dr.Rourk- normal rectum, pancolonic diverticulosis: o/w the remainder of the colonic mucosa appeared normal. it is notable the left colon was somewhat stiff and noncompliant.     There were no vitals filed for this visit.      Subjective Assessment - 12/26/15 1035    Subjective Patient arrives today reporing she is better and that she was gardening since 7am; however if she does too much her hips will hurt the next day and taht is really teh extent of her main  problems right now. No falls or close calls recently. She states that she has just been busy and that while she does try to keep up with HEP she has not been doing them every day.  She reports that right now her chornically painful L foot and knee are bothering her more right now than her back.  On a scale of 0-100 patient reports she feels she is around  a  20, feels very good about how she is doing overall.    Pertinent History R TKA in 2010, Left knee pain, Left foot OA.    How long can you sit comfortably? 5/30- 2 hours    How long can you stand comfortably? 5/30- 2 hours    How long can you walk comfortably? 5/30- unlimited    Diagnostic tests Imaing of Left knee and Lumbar spine with noted anterolisthesis (Gr2) at L5/S1   Currently in Pain? No/denies            [facility] PT Assessment - 12/26/15 0001    AROM   Right Hip External Rotation  60   Right Hip Internal Rotation  40   Left Hip External Rotation  60   Left Hip Internal Rotation  40   Strength   Right Hip Flexion 5/5   Right Hip Extension 3/5  Right Hip ABduction 3-/5   Left Hip Flexion 5/5   Left Hip Extension 3/5   Left Hip ABduction 3-/5   Right Knee Flexion 5/5   Right Knee Extension 5/5   Left Knee Flexion 4/5   Left Knee Extension 4+/5   Right Ankle Dorsiflexion 5/5   Left Ankle Dorsiflexion 5/5   6 minute walk test results    Aerobic Endurance Distance Walked 1406   Endurance additional comments 6MWT                      [facility] Adult PT Treatment/Exercise - 12/26/15 0001    High Level Balance   High Level Balance Comments L SLS 7 second max, R SLS 10 second max    Lumbar Exercises: Supine   Ab Set 15 reps   AB Set Limitations 3 second hold    Glut Set 15 reps   Glut Set Limitations 3 second holds    Bridge 10 reps   Bridge Limitations 2 sets    Straight Leg Raise 10 reps   Straight Leg Raises Limitations 2 sets    Other Supine Lumbar Exercises supine hip abduction 2x5 red TB                  PT Education - 12/26/15 1121    Education provided Yes   Education Details progress in skilled PT services, POC, importance of balance training in preventing falls    Person(s) Educated Patient   Methods Explanation   Comprehension Verbalized understanding          PT Short Term Goals - 12/26/15 1054    PT SHORT TERM GOAL #1   Title After 2 weeks pt will demonstrat indep in HEP.    Baseline 5/30- independent but not doing every day    Period Weeks   Status New   PT SHORT TERM GOAL #2   Title After three weeks, pt will demonstrate 4/5 strength in BLE.    Baseline 5/30- leg msuculature is ok, but hips still weak and patient reports night/morning pain ihips    Status Achieved   PT SHORT TERM GOAL #3   Title After three weeks, pt will demonstrate good education about avoidance of prolonged lordosis, and self management of symptoms.    Baseline 5/30- patient states "I slouch"    Period Weeks   Status On-going           PT Long Term Goals - 12/26/15 1055    PT LONG TERM GOAL #1   Title After six weeks pt will demonstrate indep in advanced HEP for DC.    PT LONG TERM GOAL #2   Title After 6 weeks, pt will demonstrate greater 4/5 strength in BLE to improve functional strength during leisure/work activities.    Baseline 5/30- LEs Ok, hip musculautre is weak    Period Weeks   Status On-going   PT LONG TERM GOAL #3   Title After 6 weeks, pt will demonstrate SLS >30s bilat to decrease risk of falls at home/community.    Baseline 5/30- 7-10 second max today    Period Weeks   Status On-going               Plan - 12/26/15 1104    Clinical Impression Statement Re-assessment performed today. Patient reports she is doing very well, at a subjective rating of 85/100, but does report taht she is continuing to have some pain at night and in the  mornings. Patient does show rather signfiicant ongoing weakness in major functional groups such as hip abductors and  extensors at this point, which could possible be impacting her pain  as well. Patient also continues to show reduced balance skills, although these have improved as well. At this point recommend ongoing skilled PT services ni order to address remaining functional limtiations and assist in reaching optimal level of function with minimal fall risk.    Rehab Potential Good   Clinical Impairments Affecting Rehab Potential L5/S1 spondylolisthesis Grade 2;    PT Frequency 2x / week   PT Duration 3 weeks   PT Treatment/Interventions ADLs/Self Care Home Management;Therapeutic exercise;Therapeutic activities;Balance training;Manual techniques;Patient/family education   PT Next Visit Plan focus on functional strengthening and balance; continue to educate and promote compression hose; promote HEP adherence    PT Home Exercise Plan No updates today, needs better compliance.    Recommended Other Services Avoid activities that involve end-range lumbar lordosis, or repeated lumbar extension. Pt has a grade-2 spondylolisthesis   Consulted and Agree with Plan of Care Patient      Patient will benefit from skilled therapeutic intervention in order to improve the following deficits and impairments:  Abnormal gait, Decreased activity tolerance, Decreased balance, Decreased strength, Increased edema, Hypomobility, Decreased skin integrity, Pain  Visit Diagnosis: Left-sided low back pain without sciatica  Pain in left hip  Muscle weakness (generalized)  Unsteadiness on feet       G-Codes - 01-13-16 1124    Functional Assessment Tool Used Based on skilled clinical assessment of strenth, balance, ROM, gait    Functional Limitation Mobility: Walking and moving around   Mobility: Walking and Moving Around Current Status [phone]) At least 20 percent but less than 40 percent impaired, limited or restricted   Mobility: Walking and Moving Around Goal Status [phone]) At least 1 percent but less than 20 percent  impaired, limited or restricted      Problem List Patient Active Problem List   Diagnosis Date Noted  . Change in stool caliber 01/05/2015  . Encounter for screening colonoscopy 04/30/2012  . RLQ fullness 04/30/2012  . PALPITATIONS 02/23/2009  . VISUAL IMPAIRMENT 05/11/2008  . OSTEOARTHRITIS, KNEES, BILATERAL 02/18/2008  . ELEVATED BLOOD PRESSURE WITHOUT DIAGNOSIS OF HYPERTENSION 11/17/2007  . MEMORY LOSS 08/17/2007  . MRI, BRAIN, ABNORMAL 08/17/2007  . OSTEOPENIA 07/08/2007  . ANXIETY DEPRESSION 05/08/2007  . HYPERLIPIDEMIA 04/24/2007  . DECREASED HEARING 04/24/2007  . PULMONARY NODULE 04/24/2007    Deniece Ree PT, DPT [facility] [address] [place], [place], [address] Phone: [phone]   Fax:  [phone]  Name: RAELEEN MALY MRN: [id number] Date of Birth: [birth year]

## 2015-12-28 ENCOUNTER — Ambulatory Visit (HOSPITAL_COMMUNITY): Payer: Commercial Managed Care - HMO | Attending: Orthopaedic Surgery | Admitting: Physical Therapy

## 2015-12-28 ENCOUNTER — Ambulatory Visit (INDEPENDENT_AMBULATORY_CARE_PROVIDER_SITE_OTHER): Payer: Commercial Managed Care - HMO | Admitting: Otolaryngology

## 2015-12-28 DIAGNOSIS — M6281 Muscle weakness (generalized): Secondary | ICD-10-CM | POA: Insufficient documentation

## 2015-12-28 DIAGNOSIS — R2681 Unsteadiness on feet: Secondary | ICD-10-CM | POA: Diagnosis not present

## 2015-12-28 DIAGNOSIS — M545 Low back pain, unspecified: Secondary | ICD-10-CM

## 2015-12-28 DIAGNOSIS — M25552 Pain in left hip: Secondary | ICD-10-CM | POA: Diagnosis not present

## 2015-12-28 NOTE — Therapy (Signed)
[place], [place], [address] Phone: [phone]   Fax:  [phone]  Physical Therapy Treatment  Patient Details  Name: Jennifer Cruz MRN: [id number] Date of Birth: [birth year] Referring Provider: Joni Fears   Encounter Date: 12/28/2015      PT End of Session - 12/28/15 1030    Visit Number 7   Number of Visits 12   Date for PT Re-Evaluation 01/18/16   Authorization Type Medicare (G-codes done 6th visit)   Authorization - Visit Number 7   Authorization - Number of Visits 16   PT Start Time 0815   PT Stop Time [id number]   PT Time Calculation (min) 40 min   Activity Tolerance Patient tolerated treatment well   Behavior During Therapy [facility] for tasks assessed/performed      Past Medical History  Diagnosis Date  . Hypertension   . Hyperlipidemia   . Anxiety   . Depression   . Osteoarthritis   . Memory loss     work up negative for Alzheimer's per medical records.  . Pulmonary nodules     per patient work-up completed at [facility] and no further evaluation needed  . Dysrhythmia     palpitations  . Diverticulosis     Past Surgical History  Procedure Laterality Date  . Tonsillectomy    . Total knee arthroplasty  05/2009    right  . Breast enhancement surgery    . Cataract extraction  2009  . Refractive surgery  2010  . Incomplete colonoscopy  2005    inadequate sedation, [facility]  . Colonoscopy with propofol N/A 09/17/2012    Dr.Rourk- normal rectum, pancolonic diverticulosis: o/w the remainder of the colonic mucosa appeared normal. it is notable the left colon was somewhat stiff and noncompliant.     There were no vitals filed for this visit.      Subjective Assessment - 12/28/15 0819    Subjective PT states she has mosst pain at night and when she first gets up in the morning.   States no pain currently . Continues to work in her garden lifting 20-30# flower pots.   Currently in Pain? No/denies                          [facility] Adult PT Treatment/Exercise - 12/28/15 0001    Lumbar Exercises: Supine   Ab Set 15 reps   AB Set Limitations 3 second hold    Glut Set 15 reps   Glut Set Limitations 3 second holds    Bridge 10 reps   Bridge Limitations 2 sets    Straight Leg Raise 10 reps   Straight Leg Raises Limitations 2 sets    Lumbar Exercises: Sidelying   Hip Abduction 10 reps   Knee/Hip Exercises: Stretches   Active Hamstring Stretch Both;2 reps;30 seconds   Active Hamstring Stretch Limitations supine with rope                  PT Short Term Goals - 12/26/15 1054    PT SHORT TERM GOAL #1   Title After 2 weeks pt will demonstrat indep in HEP.    Baseline 5/30- independent but not doing every day    Period Weeks   Status New   PT SHORT TERM GOAL #2   Title After three weeks, pt will demonstrate 4/5 strength in BLE.    Baseline 5/30- leg msuculature is ok, but hips  still weak and patient reports night/morning pain ihips    Status Achieved   PT SHORT TERM GOAL #3   Title After three weeks, pt will demonstrate good education about avoidance of prolonged lordosis, and self management of symptoms.    Baseline 5/30- patient states "I slouch"    Period Weeks   Status On-going           PT Long Term Goals - 12/26/15 1055    PT LONG TERM GOAL #1   Title After six weeks pt will demonstrate indep in advanced HEP for DC.    PT LONG TERM GOAL #2   Title After 6 weeks, pt will demonstrate greater 4/5 strength in BLE to improve functional strength during leisure/work activities.    Baseline 5/30- LEs Ok, hip musculautre is weak    Period Weeks   Status On-going   PT LONG TERM GOAL #3   Title After 6 weeks, pt will demonstrate SLS >30s bilat to decrease risk of falls at home/community.    Baseline 5/30- 7-10 second max today    Period Weeks   Status On-going               Plan - 12/28/15 1031    Clinical Impression Statement Focused mainly  on gentle stretches and strengthening this session due to pateint request.  She is going out of town and doesn't want to be sore from new actvities.  did , however work on proper lifting as she does alot of gardening and lifting/moving flower pots.  Pt with noted narrow BOS.  Worked on increasing BOS and getting a deeper squat to protect her LB.  Educated on continueing her HEP while on vacation and developing an improved lifting habbit.  Also encouraged to obtain compression hose ASAP.   Rehab Potential Good   Clinical Impairments Affecting Rehab Potential L5/S1 spondylolisthesis Grade 2;    PT Frequency 2x / week   PT Duration 3 weeks   PT Treatment/Interventions ADLs/Self Care Home Management;Therapeutic exercise;Therapeutic activities;Balance training;Manual techniques;Patient/family education   PT Next Visit Plan focus on functional strengthening and balance; continue to educate and promote compression hose; promote HEP adherence    Consulted and Agree with Plan of Care Patient      Patient will benefit from skilled therapeutic intervention in order to improve the following deficits and impairments:  Abnormal gait, Decreased activity tolerance, Decreased balance, Decreased strength, Increased edema, Hypomobility, Decreased skin integrity, Pain  Visit Diagnosis: Left-sided low back pain without sciatica  Pain in left hip  Muscle weakness (generalized)  Unsteadiness on feet     Problem List Patient Active Problem List   Diagnosis Date Noted  . Change in stool caliber 01/05/2015  . Encounter for screening colonoscopy 04/30/2012  . RLQ fullness 04/30/2012  . PALPITATIONS 02/23/2009  . VISUAL IMPAIRMENT 05/11/2008  . OSTEOARTHRITIS, KNEES, BILATERAL 02/18/2008  . ELEVATED BLOOD PRESSURE WITHOUT DIAGNOSIS OF HYPERTENSION 11/17/2007  . MEMORY LOSS 08/17/2007  . MRI, BRAIN, ABNORMAL 08/17/2007  . OSTEOPENIA 07/08/2007  . ANXIETY DEPRESSION 05/08/2007  . HYPERLIPIDEMIA 04/24/2007   . DECREASED HEARING 04/24/2007  . PULMONARY NODULE 04/24/2007    Teena Irani, PTA/CLT [phone]  12/28/2015, 10:36 AM  [facility] [address] [place], [place], [address] Phone: [phone]   Fax:  [phone]  Name: Jennifer Cruz MRN: [id number] Date of Birth: Mar 13, 1931

## 2016-01-02 ENCOUNTER — Telehealth (HOSPITAL_COMMUNITY): Payer: Self-pay

## 2016-01-02 ENCOUNTER — Encounter (HOSPITAL_COMMUNITY): Payer: Commercial Managed Care - HMO | Admitting: Physical Therapy

## 2016-01-02 DIAGNOSIS — K649 Unspecified hemorrhoids: Secondary | ICD-10-CM | POA: Diagnosis not present

## 2016-01-02 NOTE — Telephone Encounter (Signed)
01/02/16 called to say she wouldn't be here today

## 2016-01-04 ENCOUNTER — Ambulatory Visit (HOSPITAL_COMMUNITY): Payer: Commercial Managed Care - HMO | Admitting: Physical Therapy

## 2016-01-04 DIAGNOSIS — M6281 Muscle weakness (generalized): Secondary | ICD-10-CM | POA: Diagnosis not present

## 2016-01-04 DIAGNOSIS — M545 Low back pain, unspecified: Secondary | ICD-10-CM

## 2016-01-04 DIAGNOSIS — M25552 Pain in left hip: Secondary | ICD-10-CM | POA: Diagnosis not present

## 2016-01-04 DIAGNOSIS — R2681 Unsteadiness on feet: Secondary | ICD-10-CM

## 2016-01-04 NOTE — Therapy (Signed)
[facility] [place], [place], [address] Phone: [phone]   Fax:  [phone]  Physical Therapy Treatment  Patient Details  Name: Jennifer Cruz MRN: [id number] Date of Birth: Oct 14, 1930 Referring Provider: Joni Fears   Encounter Date: 01/04/2016      PT End of Session - 01/04/16 0943    Visit Number 8   Number of Visits 12   Date for PT Re-Evaluation 01/18/16   Authorization Type Medicare (G-codes done 6th visit)   Authorization - Visit Number 8   Authorization - Number of Visits 16   PT Start Time 0818   PT Stop Time 0900   PT Time Calculation (min) 42 min   Activity Tolerance Patient tolerated treatment well   Behavior During Therapy [facility] for tasks assessed/performed      Past Medical History  Diagnosis Date  . Hypertension   . Hyperlipidemia   . Anxiety   . Depression   . Osteoarthritis   . Memory loss     work up negative for Alzheimer's per medical records.  . Pulmonary nodules     per patient work-up completed at [facility] and no further evaluation needed  . Dysrhythmia     palpitations  . Diverticulosis     Past Surgical History  Procedure Laterality Date  . Tonsillectomy    . Total knee arthroplasty  05/2009    right  . Breast enhancement surgery    . Cataract extraction  2009  . Refractive surgery  2010  . Incomplete colonoscopy  2005    inadequate sedation, [facility]  . Colonoscopy with propofol N/A 09/17/2012    Dr.Rourk- normal rectum, pancolonic diverticulosis: o/w the remainder of the colonic mucosa appeared normal. it is notable the left colon was somewhat stiff and noncompliant.     There were no vitals filed for this visit.      Subjective Assessment - 01/04/16 0838    Subjective Pt states she is not having pain or has not had any in a while.  Pt states she does have muscle cramps from time to time in her LE's.  Pt has been working in her yard picking up limbs and twigs but is  surprisingly not sore.     Currently in Pain? No/denies                         [facility] Adult PT Treatment/Exercise - 01/04/16 0001    Lumbar Exercises: Supine   Ab Set 15 reps   AB Set Limitations 3 second hold    Glut Set 15 reps   Glut Set Limitations 3 second holds    Bridge 15 reps   Bridge Limitations 2 sets    Straight Leg Raise 15 reps   Straight Leg Raises Limitations 2 sets    Lumbar Exercises: Sidelying   Hip Abduction 10 reps   Hip Abduction Limitations 2 sets   Knee/Hip Exercises: Stretches   Active Hamstring Stretch Both;2 reps;30 seconds   Active Hamstring Stretch Limitations standing on 2nd step   Knee/Hip Exercises: Standing   Heel Raises 20 reps   Heel Raises Limitations toeraises 20 reps   Forward Lunges Both;10 reps   Forward Lunges Limitations onto 4" step   Hip Abduction Both;10 reps   Hip Extension Both;10 reps   Functional Squat 10 reps                PT Education - 01/04/16 [id number]  Education provided Yes   Education Designer, jewellery and completing HEP daily.  Updated HEP   Person(s) Educated Patient   Methods Explanation;Demonstration;Handout   Comprehension Verbalized understanding;Returned demonstration          PT Short Term Goals - 12/26/15 1054    PT SHORT TERM GOAL #1   Title After 2 weeks pt will demonstrat indep in HEP.    Baseline 5/30- independent but not doing every day    Period Weeks   Status New   PT SHORT TERM GOAL #2   Title After three weeks, pt will demonstrate 4/5 strength in BLE.    Baseline 5/30- leg msuculature is ok, but hips still weak and patient reports night/morning pain ihips    Status Achieved   PT SHORT TERM GOAL #3   Title After three weeks, pt will demonstrate good education about avoidance of prolonged lordosis, and self management of symptoms.    Baseline 5/30- patient states "I slouch"    Period Weeks   Status On-going           PT Long Term Goals - 12/26/15 1055     PT LONG TERM GOAL #1   Title After six weeks pt will demonstrate indep in advanced HEP for DC.    PT LONG TERM GOAL #2   Title After 6 weeks, pt will demonstrate greater 4/5 strength in BLE to improve functional strength during leisure/work activities.    Baseline 5/30- LEs Ok, hip musculautre is weak    Period Weeks   Status On-going   PT LONG TERM GOAL #3   Title After 6 weeks, pt will demonstrate SLS >30s bilat to decrease risk of falls at home/community.    Baseline 5/30- 7-10 second max today    Period Weeks   Status On-going               Plan - 01/04/16 0944    Clinical Impression Statement Continued focus on improving LE flexibility in hamstrings and increasing functional strength.   Updated HEP to include newly added standing exercises (lunges, squats, standing hamstring stretch, hip extension and abduction).  Pt able to complete with multimodal cues.  Also reveiwed body mechanics including proper ground retrieval using squat form and pushing/pulling.  Pt able to demonstrate correctly 50% and will need more reinforcement.  Pt is overall improving with decreasing pain.     Rehab Potential Good   Clinical Impairments Affecting Rehab Potential L5/S1 spondylolisthesis Grade 2;    PT Frequency 2x / week   PT Duration 3 weeks   PT Treatment/Interventions ADLs/Self Care Home Management;Therapeutic exercise;Therapeutic activities;Balance training;Manual techniques;Patient/family education   PT Next Visit Plan focus on functional strengthening and balance; continue to educate and promote compression hose; promote HEP adherence.  Next session add postural 3 with theraband and give infor sheet on body mechanics (see Amy)   Consulted and Agree with Plan of Care Patient      Patient will benefit from skilled therapeutic intervention in order to improve the following deficits and impairments:  Abnormal gait, Decreased activity tolerance, Decreased balance, Decreased strength, Increased  edema, Hypomobility, Decreased skin integrity, Pain  Visit Diagnosis: Left-sided low back pain without sciatica  Pain in left hip  Muscle weakness (generalized)  Unsteadiness on feet     Problem List Patient Active Problem List   Diagnosis Date Noted  . Change in stool caliber 01/05/2015  . Encounter for screening colonoscopy 04/30/2012  . RLQ fullness 04/30/2012  . PALPITATIONS  02/23/2009  . VISUAL IMPAIRMENT 05/11/2008  . OSTEOARTHRITIS, KNEES, BILATERAL 02/18/2008  . ELEVATED BLOOD PRESSURE WITHOUT DIAGNOSIS OF HYPERTENSION 11/17/2007  . MEMORY LOSS 08/17/2007  . MRI, BRAIN, ABNORMAL 08/17/2007  . OSTEOPENIA 07/08/2007  . ANXIETY DEPRESSION 05/08/2007  . HYPERLIPIDEMIA 04/24/2007  . DECREASED HEARING 04/24/2007  . PULMONARY NODULE 04/24/2007    Teena Irani, PTA/CLT [phone]  01/04/2016, 9:51 AM  [facility] [address] [place], [place], [address] Phone: [phone]   Fax:  [phone]  Name: Jennifer Cruz MRN: [id number] Date of Birth: 1931/03/08

## 2016-01-04 NOTE — Patient Instructions (Signed)
EXTENSION: Standing (Active)    Stand, both feet flat. Draw right leg behind body as far as possible. Complete _2__ sets of _10__ repetitions. Perform _2__ sessions per day.   Hip Abduction (Standing)    Stand with support. Squeeze pelvic floor and hold. Lift right leg out to side, keeping toe forward. Repeat 10 times, 2 sets, 2X day  Forward Lunge    Standing with feet shoulder width apart and stomach tight, step forward with left leg.Repeat __10__ times per set. Do __2__ sets __2__ sessions per day.  Hamstring Stretch (Standing)    Standing, place one heel on chair or bench. Use one or both hands on thigh for support. Keeping torso straight, lean forward slowly until a stretch is felt in back of same thigh. Hold __30__ seconds. Repeat with other leg.

## 2016-01-08 DIAGNOSIS — R110 Nausea: Secondary | ICD-10-CM | POA: Diagnosis not present

## 2016-01-08 DIAGNOSIS — K5731 Diverticulosis of large intestine without perforation or abscess with bleeding: Secondary | ICD-10-CM | POA: Diagnosis not present

## 2016-01-08 DIAGNOSIS — K921 Melena: Secondary | ICD-10-CM | POA: Diagnosis not present

## 2016-01-08 DIAGNOSIS — R194 Change in bowel habit: Secondary | ICD-10-CM | POA: Diagnosis not present

## 2016-01-09 ENCOUNTER — Ambulatory Visit (HOSPITAL_COMMUNITY): Payer: Commercial Managed Care - HMO | Admitting: Physical Therapy

## 2016-01-09 DIAGNOSIS — M545 Low back pain, unspecified: Secondary | ICD-10-CM

## 2016-01-09 DIAGNOSIS — M6281 Muscle weakness (generalized): Secondary | ICD-10-CM

## 2016-01-09 DIAGNOSIS — R2681 Unsteadiness on feet: Secondary | ICD-10-CM

## 2016-01-09 DIAGNOSIS — M25552 Pain in left hip: Secondary | ICD-10-CM | POA: Diagnosis not present

## 2016-01-09 NOTE — Therapy (Signed)
[facility] [address] [place], [place], [address] Phone: [phone]   Fax:  [phone]  Physical Therapy Treatment  Patient Details  Name: Jennifer Cruz MRN: [id number] Date of Birth: 05-19-1931 Referring Provider: Joni Fears   Encounter Date: 01/09/2016      PT End of Session - 01/09/16 1020    Visit Number 9   Number of Visits 12   Date for PT Re-Evaluation 01/18/16   Authorization Type [employer] medicare    Authorization - Visit Number 6   Authorization - Number of Visits 12   PT Start Time [phone]   PT Stop Time 1034   PT Time Calculation (min) 41 min   Activity Tolerance Patient tolerated treatment well   Behavior During Therapy [facility] for tasks assessed/performed      Past Medical History  Diagnosis Date  . Hypertension   . Hyperlipidemia   . Anxiety   . Depression   . Osteoarthritis   . Memory loss     work up negative for Alzheimer's per medical records.  . Pulmonary nodules     per patient work-up completed at [facility] and no further evaluation needed  . Dysrhythmia     palpitations  . Diverticulosis     Past Surgical History  Procedure Laterality Date  . Tonsillectomy    . Total knee arthroplasty  05/2009    right  . Breast enhancement surgery    . Cataract extraction  2009  . Refractive surgery  2010  . Incomplete colonoscopy  2005    inadequate sedation, [facility]  . Colonoscopy with propofol N/A 09/17/2012    Dr.Rourk- normal rectum, pancolonic diverticulosis: o/w the remainder of the colonic mucosa appeared normal. it is notable the left colon was somewhat stiff and noncompliant.     There were no vitals filed for this visit.      Subjective Assessment - 01/09/16 0958    Subjective Pt states that she has been feeling so good that she has overdone things at home ie gardenting    Currently in Pain? No/denies                         [facility] Adult PT Treatment/Exercise - 01/09/16 0001     Lumbar Exercises: Stretches   Active Hamstring Stretch 3 reps;30 seconds   Active Hamstring Stretch Limitations supine    Passive Hamstring Stretch 3 reps;30 seconds   Passive Hamstring Stretch Limitations standing with 15" box    Piriformis Stretch Limitations slant board stretch 3 x 30"    Lumbar Exercises: Standing   Heel Raises 10 reps   Functional Squats 10 reps   Other Standing Lumbar Exercises 3 D hip excursion x 3    Lumbar Exercises: Supine   Bent Knee Raise 10 reps   Bridge 15 reps   Bridge Limitations 2 sets    Straight Leg Raise 15 reps   Isometric Hip Flexion 10 reps   Lumbar Exercises: Sidelying   Hip Abduction 10 reps   Hip Abduction Limitations 2 sets   Lumbar Exercises: Prone   Single Arm Raise Left;Right;10 reps                PT Education - 01/09/16 1025    Education provided Yes   Education Details Back injury prevention    Person(s) Educated Patient   Methods Handout   Comprehension Verbalized understanding          PT  Short Term Goals - 12/26/15 1054    PT SHORT TERM GOAL #1   Title After 2 weeks pt will demonstrat indep in HEP.    Baseline 5/30- independent but not doing every day    Period Weeks   Status New   PT SHORT TERM GOAL #2   Title After three weeks, pt will demonstrate 4/5 strength in BLE.    Baseline 5/30- leg msuculature is ok, but hips still weak and patient reports night/morning pain ihips    Status Achieved   PT SHORT TERM GOAL #3   Title After three weeks, pt will demonstrate good education about avoidance of prolonged lordosis, and self management of symptoms.    Baseline 5/30- patient states "I slouch"    Period Weeks   Status On-going           PT Long Term Goals - 12/26/15 1055    PT LONG TERM GOAL #1   Title After six weeks pt will demonstrate indep in advanced HEP for DC.    PT LONG TERM GOAL #2   Title After 6 weeks, pt will demonstrate greater 4/5 strength in BLE to improve functional strength during  leisure/work activities.    Baseline 5/30- LEs Ok, hip musculautre is weak    Period Weeks   Status On-going   PT LONG TERM GOAL #3   Title After 6 weeks, pt will demonstrate SLS >30s bilat to decrease risk of falls at home/community.    Baseline 5/30- 7-10 second max today    Period Weeks   Status On-going               Plan - 01/09/16 1021    Clinical Impression Statement Overall pt is doing much better.  Added prone strengthening; slant board and hamstring stretches for improved flexibility.  Pt given Back prevention injury sheet.  Pt progressing well and appears to be ready for discharge next week.    Rehab Potential Good   PT Frequency 2x / week   PT Duration 6 weeks   PT Next Visit Plan continue with core standing activity adding lunging to program      Patient will benefit from skilled therapeutic intervention in order to improve the following deficits and impairments:     Visit Diagnosis: Left-sided low back pain without sciatica  Pain in left hip  Unsteadiness on feet  Muscle weakness (generalized)     Problem List Patient Active Problem List   Diagnosis Date Noted  . Change in stool caliber 01/05/2015  . Encounter for screening colonoscopy 04/30/2012  . RLQ fullness 04/30/2012  . PALPITATIONS 02/23/2009  . VISUAL IMPAIRMENT 05/11/2008  . OSTEOARTHRITIS, KNEES, BILATERAL 02/18/2008  . ELEVATED BLOOD PRESSURE WITHOUT DIAGNOSIS OF HYPERTENSION 11/17/2007  . MEMORY LOSS 08/17/2007  . MRI, BRAIN, ABNORMAL 08/17/2007  . OSTEOPENIA 07/08/2007  . ANXIETY DEPRESSION 05/08/2007  . HYPERLIPIDEMIA 04/24/2007  . DECREASED HEARING 04/24/2007  . PULMONARY NODULE 04/24/2007    Rayetta Humphrey, PT CLT [phone] 01/09/2016, 10:33 AM  [facility] [address] [place], [place], [address] Phone: [phone]   Fax:  [phone]  Name: Jennifer Cruz MRN: [id number] Date of Birth: Jan 11, 1931

## 2016-01-11 ENCOUNTER — Ambulatory Visit (HOSPITAL_COMMUNITY): Payer: Commercial Managed Care - HMO | Admitting: Physical Therapy

## 2016-01-16 ENCOUNTER — Encounter (HOSPITAL_COMMUNITY): Payer: Commercial Managed Care - HMO | Admitting: Physical Therapy

## 2016-01-18 ENCOUNTER — Ambulatory Visit (HOSPITAL_COMMUNITY): Payer: Commercial Managed Care - HMO | Admitting: Physical Therapy

## 2016-01-18 DIAGNOSIS — M545 Low back pain, unspecified: Secondary | ICD-10-CM

## 2016-01-18 DIAGNOSIS — R2681 Unsteadiness on feet: Secondary | ICD-10-CM | POA: Diagnosis not present

## 2016-01-18 DIAGNOSIS — M25552 Pain in left hip: Secondary | ICD-10-CM

## 2016-01-18 DIAGNOSIS — M6281 Muscle weakness (generalized): Secondary | ICD-10-CM | POA: Diagnosis not present

## 2016-01-18 NOTE — Therapy (Signed)
[facility] [address] [place], [place], [address] Phone: [phone]   Fax:  [phone]  Physical Therapy Treatment  Patient Details  Name: Jennifer Cruz MRN: [id number] Date of Birth: Aug 11, 1930 Referring Provider: Joni Fears  Encounter Date: 01/18/2016      PT End of Session - 01/18/16 1210    Visit Number 10   Number of Visits 12   Date for PT Re-Evaluation 01/18/16   Authorization Type [employer] medicare    Authorization - Visit Number 10   Authorization - Number of Visits 12   PT Start Time 7902   PT Stop Time 1112   PT Time Calculation (min) 40 min   Activity Tolerance Patient tolerated treatment well   Behavior During Therapy [facility] for tasks assessed/performed      Past Medical History  Diagnosis Date  . Hypertension   . Hyperlipidemia   . Anxiety   . Depression   . Osteoarthritis   . Memory loss     work up negative for Alzheimer's per medical records.  . Pulmonary nodules     per patient work-up completed at [facility] and no further evaluation needed  . Dysrhythmia     palpitations  . Diverticulosis     Past Surgical History  Procedure Laterality Date  . Tonsillectomy    . Total knee arthroplasty  05/2009    right  . Breast enhancement surgery    . Cataract extraction  2009  . Refractive surgery  2010  . Incomplete colonoscopy  2005    inadequate sedation, [facility]  . Colonoscopy with propofol N/A 09/17/2012    Dr.Rourk- normal rectum, pancolonic diverticulosis: o/w the remainder of the colonic mucosa appeared normal. it is notable the left colon was somewhat stiff and noncompliant.     There were no vitals filed for this visit.      Subjective Assessment - 01/18/16 1031    Subjective Pt states she is doing great. No issues other than minor pain along her Rt side which she is not concerned about. She has not been doing her HEP as much as she should, but she plans to do more of it once she finishes her  PT. She feels she is ready for discharge today.    Pertinent History R TKA in 2010, Left knee pain, Left foot OA.    How long can you sit comfortably? 2 hours   How long can you stand comfortably? 2 hours   How long can you walk comfortably? unlimited   Diagnostic tests Imaing of Left knee and Lumbar spine with noted anterolisthesis (Gr2) at L5/S1   Currently in Pain? No/denies            [facility] PT Assessment - 01/18/16 0001    Assessment   Medical Diagnosis Left gluteal pain    Referring Provider Joni Fears   Onset Date/Surgical Date 03/30/15   Hand Dominance Right   Next MD Visit None as of now   Prior Therapy None, just for knee replacement 7YA   Precautions   Precautions None   Balance Screen   Has the patient fallen in the past 6 months No   Has the patient had a decrease in activity level because of a fear of falling?  No   Is the patient reluctant to leave their home because of a fear of falling?  No   Prior Function   Level of Independence Independent   Vocation Retired;Works at home  about  15-20hours weekly gardening for fall   Vocation Requirements squatting, kneeling, bending.   Leisure Driving trips nearby   Cognition   Overall Cognitive Status Within Functional Limits for tasks assessed   Posture/Postural Control   Posture/Postural Control No significant limitations   Strength   Right Hip Flexion 5/5   Right Hip Extension 4/5   Right Hip ABduction 4-/5   Left Hip Flexion 5/5   Left Hip Extension 4/5   Left Hip ABduction 4-/5   Right Knee Flexion 5/5   Right Knee Extension 5/5   Left Knee Flexion 4+/5  pain limited   Left Knee Extension 4+/5  pain limited   Right Ankle Dorsiflexion 5/5   Left Ankle Dorsiflexion 5/5   6 minute walk test results    Aerobic Endurance Distance Walked 1500   Endurance additional comments 6 MWT   High Level Balance   High Level Balance Comments SLS: R 25 sec, L 25 sec.                      [facility] Adult  PT Treatment/Exercise - 01/18/16 0001    Knee/Hip Exercises: Stretches   Piriformis Stretch Right;2 reps;30 seconds   Knee/Hip Exercises: Supine   Bridges 2 sets;15 reps   Knee/Hip Exercises: Sidelying   Other Sidelying Knee/Hip Exercises 2x15 each with green TB                PT Education - 01/18/16 1114    Education provided Yes   Education Details correct lifting technique; importance of HEP adherence for contineud improvement in strength/mobility.   Person(s) Educated Patient   Methods Explanation;Demonstration;Handout   Comprehension Verbalized understanding;Returned demonstration          PT Short Term Goals - 01/18/16 1048    PT SHORT TERM GOAL #1   Title After 2 weeks pt will demonstrat indep in HEP.    Baseline 5/30- independent but not doing every day    Period Weeks   Status Partially Met   PT SHORT TERM GOAL #2   Title After three weeks, pt will demonstrate 4/5 strength in BLE.    Baseline 5/30- leg msuculature is ok, but hips still weak and patient reports night/morning pain ihips; 4-/5 or greater   Status Achieved   PT SHORT TERM GOAL #3   Title After three weeks, pt will demonstrate good education about avoidance of prolonged lordosis, and self management of symptoms.    Baseline 5/30- patient states "I slouch"; pt able to correct her posture and states her husband helps remind her   Period Weeks   Status Achieved           PT Long Term Goals - 01/18/16 1050    PT LONG TERM GOAL #1   Title After six weeks pt will demonstrate indep in advanced HEP for DC.    Status Partially Met   PT LONG TERM GOAL #2   Title After 6 weeks, pt will demonstrate greater 4/5 strength in BLE to improve functional strength during leisure/work activities.    Baseline 5/30- LEs Ok, hip musculautre is weak    Period Weeks   Status Partially Met   PT LONG TERM GOAL #3   Title After 6 weeks, pt will demonstrate SLS >30s bilat to decrease risk of falls at home/community.     Baseline 5/30- 7-10 second max today ; BLE: 25 sec   Period Weeks   Status Achieved  Plan - 02/11/16 1211    Clinical Impression Statement Pt was reassessed this visit have made significant progress towards all goals. She repots she has returned to full activity without any issues or pain. She feels pleased enough with her progress to be discharged at this time and continue with her HEP at home. Overall, she does demonstrate improvements in BLE strength, balance and functional endurance evident by 6MWT. Remaining deficits are her hip abductor/extensor weakness which I followed with reviewing importance of hip strength in daily activity, etc. and encouraged her to consistently perform therex given to her in order to address the remaining deficits. Pt is in agreement that when she does her HEP she feels a lot better and will work on that at home. Although she could benefit from continued skilled PT services to address hip weakness, she is pleased with her current level of function/pain free and is being discharged today with several goals met and the remaining goals partially met.    Rehab Potential Good   PT Frequency 2x / week   PT Duration 6 weeks   PT Treatment/Interventions ADLs/Self Care Home Management;Therapeutic exercise;Therapeutic activities;Balance training;Manual techniques;Patient/family education   PT Next Visit Plan d/c   PT Home Exercise Plan updated with piriformis stretch; discussed importance of HEP adherence especially now that she will not be coming to PT during the week.   Consulted and Agree with Plan of Care Patient      Patient will benefit from skilled therapeutic intervention in order to improve the following deficits and impairments:     Visit Diagnosis: Left-sided low back pain without sciatica  Pain in left hip  Unsteadiness on feet  Muscle weakness (generalized)       G-Codes - Feb 11, 2016 1224    Functional Assessment Tool Used  Clinical judgement based on assessment of strength, balance and 6MWT   Functional Limitation Mobility: Walking and moving around   Mobility: Walking and Moving Around Goal Status [phone]) At least 1 percent but less than 20 percent impaired, limited or restricted   Mobility: Walking and Moving Around Discharge Status [phone]) At least 1 percent but less than 20 percent impaired, limited or restricted      Problem List Patient Active Problem List   Diagnosis Date Noted  . Change in stool caliber 01/05/2015  . Encounter for screening colonoscopy 04/30/2012  . RLQ fullness 04/30/2012  . PALPITATIONS 02/23/2009  . VISUAL IMPAIRMENT 05/11/2008  . OSTEOARTHRITIS, KNEES, BILATERAL 02/18/2008  . ELEVATED BLOOD PRESSURE WITHOUT DIAGNOSIS OF HYPERTENSION 11/17/2007  . MEMORY LOSS 08/17/2007  . MRI, BRAIN, ABNORMAL 08/17/2007  . OSTEOPENIA 07/08/2007  . ANXIETY DEPRESSION 05/08/2007  . HYPERLIPIDEMIA 04/24/2007  . DECREASED HEARING 04/24/2007  . PULMONARY NODULE 04/24/2007   PHYSICAL THERAPY DISCHARGE SUMMARY  Visits from Start of Care: 10  Current functional level related to goals / functional outcomes: Improved BLE strength, 6MWT: 153f, pain free and fully returned to activity   Remaining deficits: Hip extensor/abductor strength limited via MMT which will continue to improve with HEP adherence at home   Education / Equipment: LMidwife reinforced continued HEP adherence   Plan: Patient agrees to discharge.  Patient goals were partially met. Patient is being discharged due to being pleased with the current functional level.  ?????       12:26 PM,007/16/17SElly ModenaPT, DPT AForest[facility] NaOutpatient Physical Therapy [address] StreetSCentral [place] [address]Phone: [phone]  Fax:  [phone]  Name: JOMAIRA DARR MRN: [id number] Date of Birth: 04/18/1931

## 2016-01-23 ENCOUNTER — Ambulatory Visit (HOSPITAL_COMMUNITY): Payer: Commercial Managed Care - HMO | Admitting: Physical Therapy

## 2016-01-25 ENCOUNTER — Encounter (HOSPITAL_COMMUNITY): Payer: Commercial Managed Care - HMO | Admitting: Physical Therapy

## 2016-01-29 ENCOUNTER — Ambulatory Visit (INDEPENDENT_AMBULATORY_CARE_PROVIDER_SITE_OTHER): Payer: Commercial Managed Care - HMO | Admitting: Otolaryngology

## 2016-01-29 DIAGNOSIS — K219 Gastro-esophageal reflux disease without esophagitis: Secondary | ICD-10-CM | POA: Diagnosis not present

## 2016-01-29 DIAGNOSIS — R490 Dysphonia: Secondary | ICD-10-CM

## 2016-01-31 DIAGNOSIS — M5137 Other intervertebral disc degeneration, lumbosacral region: Secondary | ICD-10-CM | POA: Diagnosis not present

## 2016-01-31 DIAGNOSIS — M545 Low back pain: Secondary | ICD-10-CM | POA: Diagnosis not present

## 2016-02-01 ENCOUNTER — Other Ambulatory Visit (HOSPITAL_COMMUNITY): Payer: Self-pay | Admitting: Orthopaedic Surgery

## 2016-02-01 DIAGNOSIS — M545 Low back pain: Secondary | ICD-10-CM

## 2016-02-07 DIAGNOSIS — G548 Other nerve root and plexus disorders: Secondary | ICD-10-CM | POA: Diagnosis not present

## 2016-02-07 DIAGNOSIS — M5136 Other intervertebral disc degeneration, lumbar region: Secondary | ICD-10-CM | POA: Diagnosis not present

## 2016-02-07 DIAGNOSIS — M545 Low back pain: Secondary | ICD-10-CM | POA: Diagnosis not present

## 2016-02-07 DIAGNOSIS — M47817 Spondylosis without myelopathy or radiculopathy, lumbosacral region: Secondary | ICD-10-CM | POA: Diagnosis not present

## 2016-02-07 DIAGNOSIS — M4826 Kissing spine, lumbar region: Secondary | ICD-10-CM | POA: Diagnosis not present

## 2016-02-07 DIAGNOSIS — M4317 Spondylolisthesis, lumbosacral region: Secondary | ICD-10-CM | POA: Diagnosis not present

## 2016-02-07 DIAGNOSIS — M47816 Spondylosis without myelopathy or radiculopathy, lumbar region: Secondary | ICD-10-CM | POA: Diagnosis not present

## 2016-02-12 ENCOUNTER — Ambulatory Visit (HOSPITAL_COMMUNITY): Payer: Commercial Managed Care - HMO

## 2016-03-11 ENCOUNTER — Ambulatory Visit (INDEPENDENT_AMBULATORY_CARE_PROVIDER_SITE_OTHER): Payer: Commercial Managed Care - HMO | Admitting: Otolaryngology

## 2016-03-11 DIAGNOSIS — R490 Dysphonia: Secondary | ICD-10-CM

## 2016-03-11 DIAGNOSIS — K219 Gastro-esophageal reflux disease without esophagitis: Secondary | ICD-10-CM

## 2016-04-22 ENCOUNTER — Telehealth (HOSPITAL_COMMUNITY): Payer: Self-pay

## 2016-04-22 NOTE — Telephone Encounter (Signed)
04/22/16 she called and said that she received a call stating she had a balance on her bill but has never received a paper bill.  I pulled up Account Maintance and saw where she had a $120 balance and she paid 3 copays in May and came for a total of 9 visits but she came in June and I didn't see where that information was coming up.  She was upset that she was called and told she owed money but she had never received a bill.  I explained to her that the billing comes from [facility] and gave her the phone # to contact Billing and talk to them.  I apologized and told her to just let them know what I had told her and we can't see the June visits.  They would be able to help her and that I was sorry.

## 2016-05-03 DIAGNOSIS — E785 Hyperlipidemia, unspecified: Secondary | ICD-10-CM | POA: Diagnosis not present

## 2016-05-03 DIAGNOSIS — M1990 Unspecified osteoarthritis, unspecified site: Secondary | ICD-10-CM | POA: Diagnosis not present

## 2016-05-03 DIAGNOSIS — I10 Essential (primary) hypertension: Secondary | ICD-10-CM | POA: Diagnosis not present

## 2016-05-03 DIAGNOSIS — Z79899 Other long term (current) drug therapy: Secondary | ICD-10-CM | POA: Diagnosis not present

## 2016-05-08 DIAGNOSIS — M4317 Spondylolisthesis, lumbosacral region: Secondary | ICD-10-CM | POA: Diagnosis not present

## 2016-05-09 DIAGNOSIS — Z0001 Encounter for general adult medical examination with abnormal findings: Secondary | ICD-10-CM | POA: Diagnosis not present

## 2016-05-09 DIAGNOSIS — E785 Hyperlipidemia, unspecified: Secondary | ICD-10-CM | POA: Diagnosis not present

## 2016-05-09 DIAGNOSIS — Z6822 Body mass index (BMI) 22.0-22.9, adult: Secondary | ICD-10-CM | POA: Diagnosis not present

## 2016-05-09 DIAGNOSIS — Z23 Encounter for immunization: Secondary | ICD-10-CM | POA: Diagnosis not present

## 2016-05-09 DIAGNOSIS — R413 Other amnesia: Secondary | ICD-10-CM | POA: Diagnosis not present

## 2016-05-14 DIAGNOSIS — M5136 Other intervertebral disc degeneration, lumbar region: Secondary | ICD-10-CM | POA: Diagnosis not present

## 2016-05-14 DIAGNOSIS — M4726 Other spondylosis with radiculopathy, lumbar region: Secondary | ICD-10-CM | POA: Diagnosis not present

## 2016-05-14 DIAGNOSIS — M5416 Radiculopathy, lumbar region: Secondary | ICD-10-CM | POA: Diagnosis not present

## 2016-05-14 DIAGNOSIS — M48061 Spinal stenosis, lumbar region without neurogenic claudication: Secondary | ICD-10-CM | POA: Diagnosis not present

## 2016-05-16 DIAGNOSIS — L820 Inflamed seborrheic keratosis: Secondary | ICD-10-CM | POA: Diagnosis not present

## 2016-06-04 ENCOUNTER — Ambulatory Visit (INDEPENDENT_AMBULATORY_CARE_PROVIDER_SITE_OTHER): Payer: Commercial Managed Care - HMO | Admitting: Neurology

## 2016-06-04 ENCOUNTER — Encounter: Payer: Self-pay | Admitting: Neurology

## 2016-06-04 VITALS — BP 149/74 | HR 84 | Ht 63.5 in | Wt 128.0 lb

## 2016-06-04 DIAGNOSIS — R413 Other amnesia: Secondary | ICD-10-CM | POA: Diagnosis not present

## 2016-06-04 MED ORDER — DONEPEZIL HCL 5 MG PO TABS: 5.0000 mg | ORAL_TABLET | Freq: Every day | ORAL | 1 refills | Status: DC

## 2016-06-04 NOTE — Progress Notes (Signed)
Reason for visit: Memory disturbance  Referring physician: Dr. Demetrios Loll is a 82 y.o. female  History of present illness:  Jennifer Cruz is an 82 year old right-handed white female with a history of a mild memory disturbance that developed within the last 2-3 years. The patient has had mild changes that have gradually progressed over time. The patient has not reported any problems with word finding or difficulty with names for people. She has had difficulty with repeating herself frequently and she may send emails to friends several times in a row. The patient is operating a motor vehicle without difficulty, she does not have problems with directions. She is able to manage her finances well. She cooks without difficulty. She may occasionally misplace things about the house. She has trouble keeping up with the date. The patient indicates that she sleeps well at night, she has a relatively good energy level. She denies any difficulty controlling the bowels or bladder, but she does have bowel movements frequently. The patient does have low back pain and she gets epidural steroid injections for this. She denies any balance problems, or difficulty with numbness or weakness of the extremities. Her mother had dementia around age 21, she lived until 56. The patient has had blood work done recently that has included a vitamin B12 level and thyroid profile that was reportedly normal. She has not had MRI evaluation of the brain in over 9 or 10 years. The patient is sent to this office for an evaluation. She was given a prescription for Aricept within the last month through her primary care physician, the patient and her husband do not recall this.  Past Medical History:  Diagnosis Date  . Anxiety   . Depression   . Diverticulosis   . Dysrhythmia    palpitations  . Hyperlipidemia   . Hypertension   . Memory loss    work up negative for Alzheimer's per medical records.  . Osteoarthritis   .  Pulmonary nodules    per patient work-up completed at [facility] and no further evaluation needed    Past Surgical History:  Procedure Laterality Date  . BREAST ENHANCEMENT SURGERY    . CATARACT EXTRACTION  2009  . COLONOSCOPY WITH PROPOFOL N/A 09/17/2012   Dr.Rourk- normal rectum, pancolonic diverticulosis: o/w the remainder of the colonic mucosa appeared normal. it is notable the left colon was somewhat stiff and noncompliant.   . incomplete colonoscopy  2005   inadequate sedation, [facility]  . REFRACTIVE SURGERY  2010  . TONSILLECTOMY    . TOTAL KNEE ARTHROPLASTY  05/2009   right    Family History  Problem Relation Age of Onset  . Diabetes Father   . Stroke Father   . Alzheimer's disease Mother     Social history:  reports that she quit smoking about 63 years ago. Her smoking use included Cigarettes. She has a 0.60 pack-year smoking history. She has never used smokeless tobacco. She reports that she drinks alcohol. She reports that she does not use drugs.  Medications:  Prior to Admission medications   Medication Sig Start Date End Date Taking? Authorizing Provider  B Complex Vitamins (B COMPLEX PO) daily. 05/28/16  Yes Historical Provider, MD  CALCIUM CITRATE-VITAMIN D PO daily. 05/28/16  Yes Historical Provider, MD  fish oil-omega-3 fatty acids 1000 MG capsule Take 2 g by mouth daily.   Yes Historical Provider, MD  ibuprofen (ADVIL,MOTRIN) 100 MG tablet Take 100 mg by mouth every 4 (four)  hours as needed for fever.   Yes Historical Provider, MD  ipratropium (ATROVENT) 0.06 % nasal spray  03/11/16  Yes Historical Provider, MD  Multiple Vitamins-Minerals (MULTIVITAMIN WITH MINERALS) tablet Take 1 tablet by mouth daily.   Yes Historical Provider, MD      Allergies  Allergen Reactions  . Celecoxib Hives  . Amitriptyline Hcl     REACTION: Palpatations  . Prochlorperazine Edisylate     REACTION: Used for nausea and developed tardive dyskinesia    ROS:  Out of a complete 14 system  review of symptoms, the patient complains only of the following symptoms, and all other reviewed systems are negative.  Fatigue Memory loss, confusion Anxiety  Blood pressure (!) 149/74, pulse 84, height 5' 3.5" (1.613 m), weight 128 lb (58.1 kg).  Physical Exam  General: The patient is alert and cooperative at the time of the examination.  Eyes: Pupils are equal, round, and reactive to light. Discs are flat bilaterally.  Neck: The neck is supple, no carotid bruits are noted.  Respiratory: The respiratory examination is clear.  Cardiovascular: The cardiovascular examination reveals a regular rate and rhythm, no obvious murmurs or rubs are noted.  Skin: Extremities are without significant edema.  Neurologic Exam  Mental status: The patient is alert and oriented x 3 at the time of the examination. The patient has apparent normal recent and remote memory, with an apparently normal attention span and concentration ability. Mini-Mental Status Examination done today shows a total score of 28/30.  Cranial nerves: Facial symmetry is present. There is good sensation of the face to pinprick and soft touch bilaterally. The strength of the facial muscles and the muscles to head turning and shoulder shrug are normal bilaterally. Speech is well enunciated, no aphasia or dysarthria is noted. Extraocular movements are full. Visual fields are full. The tongue is midline, and the patient has symmetric elevation of the soft palate. No obvious hearing deficits are noted.  Motor: The motor testing reveals 5 over 5 strength of all 4 extremities. Good symmetric motor tone is noted throughout.  Sensory: Sensory testing is intact to pinprick, soft touch, vibration sensation, and position sense on all 4 extremities, with exception of some slight decrease in vibration sensation and position sense in both feet. No evidence of a stocking pattern pinprick sensory deficit is noted. No evidence of extinction is  noted.  Coordination: Cerebellar testing reveals good finger-nose-finger and heel-to-shin bilaterally.  Gait and station: Gait is normal. Tandem gait is normal. Romberg is negative. No drift is seen.  Reflexes: Deep tendon reflexes are symmetric and normal bilaterally. Toes are downgoing bilaterally.   Assessment/Plan:  1. Mild memory disturbance  The patient will be sent for MRI evaluation of the brain. We will restart the Aricept taking 5 mg at night, they will call if side effects are noted. She will follow-up in 6 months. If she desires, she may be a good candidate for a research trial.   Jill Alexanders MD 06/04/2016 8:44 AM  [facility] [address] [place], [place] [address]  Phone [phone] Fax [phone]

## 2016-06-04 NOTE — Patient Instructions (Signed)
   Begin Aricept (donepezil) at 5 mg at night for one month. If this medication is well-tolerated, please call our office and we will call in a prescription for the 10 mg tablets. Look out for side effects that may include nausea, diarrhea, weight loss, or stomach cramps. This medication will also cause a runny nose, therefore there is no need for allergy medications for this purpose.  

## 2016-06-11 ENCOUNTER — Telehealth: Payer: Self-pay | Admitting: Neurology

## 2016-06-11 ENCOUNTER — Ambulatory Visit
Admission: RE | Admit: 2016-06-11 | Discharge: 2016-06-11 | Disposition: A | Discharge status: Home / Self Care | Payer: Commercial Managed Care - HMO | Source: Ambulatory Visit | Attending: Neurology | Admitting: Neurology

## 2016-06-11 DIAGNOSIS — R413 Other amnesia: Secondary | ICD-10-CM | POA: Diagnosis not present

## 2016-06-11 NOTE — Telephone Encounter (Signed)
I called patient. The patient is not really interested in seeing a psychiatrist, she wants a counselor in regards to issues within her marriage.  She does not need a physician referral for this, she should call the counselor of her choice.

## 2016-06-11 NOTE — Telephone Encounter (Signed)
Jennifer Cruz was seen as a new patient last week for memory. She scored 28/30 on MMSE. Was placed on Aricept 5 mg at bedtime. She has MRI scheduled this afternoon.

## 2016-06-11 NOTE — Telephone Encounter (Signed)
Patient is calling to get referred to a psychiatrist.

## 2016-06-13 ENCOUNTER — Telehealth: Payer: Self-pay | Admitting: Neurology

## 2016-06-13 NOTE — Telephone Encounter (Signed)
I called the patient. MRI of the brain shows temporal atrophy C/W alzheimer's. The patient is on aricept.   MRI brain 06/12/16:  IMPRESSION:  Abnormal MRI brain (without) demonstrating: 1. Moderate parietal and moderate-severe anterior and mesial temporal atrophy. 2. Few scattered periventricular and subcortical foci of non-specific gliosis. 3. No acute findings. 4. Compared to MRI on 07/13/07, there has been significant progression of atrophy.

## 2016-06-14 NOTE — Telephone Encounter (Signed)
Pt did not understand the verbal report. She is requesting a written report. She has some questions as well. Please call and advise [phone]

## 2016-06-14 NOTE — Telephone Encounter (Signed)
Called pt and she requested that MRI report be mailed to her home address. No additional questions/concerns voiced at this time.

## 2016-07-04 ENCOUNTER — Telehealth: Payer: Self-pay | Admitting: Neurology

## 2016-07-04 MED ORDER — DONEPEZIL HCL 10 MG PO TABS: 5.0000 mg | ORAL_TABLET | Freq: Every day | ORAL | 11 refills | Status: DC

## 2016-07-04 NOTE — Telephone Encounter (Signed)
Patient states she has tolerated medication donepezil (ARICEPT) 5 MG tablet and would like to increase dosage to 10 mg.

## 2016-07-04 NOTE — Addendum Note (Signed)
Addended by: Monte Fantasia on: 07/04/2016 10:02 AM   Modules accepted: Orders

## 2016-07-04 NOTE — Telephone Encounter (Signed)
Pt called to report that she has tolerated 5 mg of Aricept well since last month. New rx e-scribed for 10 mg per Dr. Jannifer Franklin' OV notes.

## 2016-07-10 ENCOUNTER — Other Ambulatory Visit: Payer: Self-pay

## 2016-07-10 MED ORDER — DONEPEZIL HCL 10 MG PO TABS: 10.0000 mg | ORAL_TABLET | Freq: Every day | ORAL | 11 refills | Status: DC

## 2016-07-10 NOTE — Telephone Encounter (Signed)
Pt called said RX was sent to wrong pharmacy, pt said she requested [facility] and the dosing is not correct. Please send new RX to [facility] in [place]. Thank you

## 2016-07-10 NOTE — Telephone Encounter (Signed)
New rx e-scribed to corrected pharmacy.

## 2016-08-08 ENCOUNTER — Ambulatory Visit (INDEPENDENT_AMBULATORY_CARE_PROVIDER_SITE_OTHER): Payer: Commercial Managed Care - HMO | Admitting: Orthopaedic Surgery

## 2016-08-08 ENCOUNTER — Ambulatory Visit (INDEPENDENT_AMBULATORY_CARE_PROVIDER_SITE_OTHER): Payer: Commercial Managed Care - HMO

## 2016-08-08 ENCOUNTER — Encounter (INDEPENDENT_AMBULATORY_CARE_PROVIDER_SITE_OTHER): Payer: Self-pay | Admitting: Orthopaedic Surgery

## 2016-08-08 VITALS — BP 162/72 | HR 89 | Resp 14 | Ht 64.0 in | Wt 127.0 lb

## 2016-08-08 DIAGNOSIS — G8929 Other chronic pain: Secondary | ICD-10-CM | POA: Diagnosis not present

## 2016-08-08 DIAGNOSIS — M25511 Pain in right shoulder: Secondary | ICD-10-CM | POA: Diagnosis not present

## 2016-08-08 MED ORDER — METHYLPREDNISOLONE ACETATE 40 MG/ML IJ SUSP
80.0000 mg | INTRAMUSCULAR | Status: AC | PRN
  Administered 2016-08-08: 80 mg

## 2016-08-08 MED ORDER — BUPIVACAINE HCL 0.5 % IJ SOLN
2.0000 mL | INTRAMUSCULAR | Status: AC | PRN
  Administered 2016-08-08: 2 mL via INTRA_ARTICULAR

## 2016-08-08 MED ORDER — LIDOCAINE HCL 1 % IJ SOLN
2.0000 mL | INTRAMUSCULAR | Status: AC | PRN
  Administered 2016-08-08: 2 mL

## 2016-08-08 NOTE — Progress Notes (Signed)
Office Visit Note   Patient: Jennifer Cruz           Date of Birth: 1931/02/26           MRN: [id number] Visit Date: 08/08/2016              Requested by: Asencion Noble, MD [address] [place], [place] [address] PCP: Asencion Noble, MD   Assessment & Plan: Visit Diagnoses: Right shoulder pain consistent with rotator cuff arthropathy  Plan: Subacromial cortisone injection right shoulder, follow-up 4-6 weeks if no improvement to consider an MRI scan.  Follow-Up Instructions: No Follow-up on file.   Orders:  No orders of the defined types were placed in this encounter.  No orders of the defined types were placed in this encounter.     Procedures: Large Joint Inj Date/Time: 08/08/2016 12:25 PM Performed by: Garald Balding Authorized by: Garald Balding   Consent Given by:  Patient Timeout: prior to procedure the correct patient, procedure, and site was verified   Indications:  Pain Location:  Shoulder Site:  L subacromial bursa Prep: patient was prepped and draped in usual sterile fashion   Needle Size:  25 G Needle Length:  1.5 inches Approach:  Lateral Ultrasound Guidance: No   Fluoroscopic Guidance: No   Arthrogram: No   Medications:  80 mg methylPREDNISolone acetate 40 MG/ML; 2 mL lidocaine 1 %; 2 mL bupivacaine 0.5 % Aspiration Attempted: No   Patient tolerance:  Patient tolerated the procedure well with no immediate complications     Clinical Data: No additional findings.   Subjective: Chief Complaint  Patient presents with  . Right Shoulder - Pain    Right shoulder pain x 2-3 months, no injury, difficulty sleeping, limited range of motion, Advil - doesn't help,   Mrs. Beiter  doesnot remember a specific injury or trauma. Denies redness or ecchymosis. Has had difficulty with overhead motion and sleeping. No numbness or tingling.  Review of Systems   Objective: Vital Signs: BP (!) 162/72 (BP Location: Right Arm, Patient Position:  Sitting, Cuff Size: Small)   Pulse 89   Resp 14   Ht 5\' 4"  (1.626 m)   Wt 127 lb (57.6 kg)   BMI 21.80 kg/m   Physical Exam  Ortho Exam examination of the right shoulder reveals full overhead motion with a painful arc. Positive impingement and positive empty can testing. Mild crepitation with internal and external rotation. Mild tenderness at the acromioilclavicular joint. No subacromial or anterior subacromial pain. Good grip and release. Biceps appears to be intact. Painless range of motion of the cervical spine.  Specialty Comments:  No specialty comments available.  Imaging: No results found.   PMFS History: Patient Active Problem List   Diagnosis Date Noted  . Change in stool caliber 01/05/2015  . Encounter for screening colonoscopy 04/30/2012  . RLQ fullness 04/30/2012  . PALPITATIONS 02/23/2009  . VISUAL IMPAIRMENT 05/11/2008  . OSTEOARTHRITIS, KNEES, BILATERAL 02/18/2008  . ELEVATED BLOOD PRESSURE WITHOUT DIAGNOSIS OF HYPERTENSION 11/17/2007  . MEMORY LOSS 08/17/2007  . MRI, BRAIN, ABNORMAL 08/17/2007  . OSTEOPENIA 07/08/2007  . ANXIETY DEPRESSION 05/08/2007  . HYPERLIPIDEMIA 04/24/2007  . DECREASED HEARING 04/24/2007  . PULMONARY NODULE 04/24/2007   Past Medical History:  Diagnosis Date  . Anxiety   . Depression   . Diverticulosis   . Dysrhythmia    palpitations  . Hyperlipidemia   . Hypertension   . Macular degeneration   . Memory loss    work  up negative for Alzheimer's per medical records.  . Osteoarthritis   . Pulmonary nodules    per patient work-up completed at [facility] and no further evaluation needed    Family History  Problem Relation Age of Onset  . Diabetes Father   . Stroke Father   . Alzheimer's disease Mother     Past Surgical History:  Procedure Laterality Date  . BREAST ENHANCEMENT SURGERY    . CATARACT EXTRACTION  2009  . COLONOSCOPY WITH PROPOFOL N/A 09/17/2012   Dr.Rourk- normal rectum, pancolonic diverticulosis: o/w the remainder  of the colonic mucosa appeared normal. it is notable the left colon was somewhat stiff and noncompliant.   . incomplete colonoscopy  2005   inadequate sedation, [facility]  . REFRACTIVE SURGERY  2010  . TONSILLECTOMY    . TOTAL KNEE ARTHROPLASTY  05/2009   right   Social History   Occupational History  . Retired     Patent attorney, Training and development officer   Social History Main Topics  . Smoking status: Former Smoker    Packs/day: 0.30    Years: 2.00    Types: Cigarettes    Quit date: 09/14/1952  . Smokeless tobacco: Never Used     Comment: quit in college  . Alcohol use 0.6 oz/week    1 Glasses of wine per week     Comment: a glass of red wine every night  . Drug use: No  . Sexual activity: Yes    Birth control/ protection: Post-menopausal

## 2016-10-09 ENCOUNTER — Encounter (INDEPENDENT_AMBULATORY_CARE_PROVIDER_SITE_OTHER): Payer: Self-pay | Admitting: Orthopaedic Surgery

## 2016-10-09 ENCOUNTER — Ambulatory Visit (INDEPENDENT_AMBULATORY_CARE_PROVIDER_SITE_OTHER): Payer: Commercial Managed Care - HMO | Admitting: Orthopaedic Surgery

## 2016-10-09 VITALS — BP 176/83 | HR 85 | Ht 62.0 in | Wt 124.0 lb

## 2016-10-09 DIAGNOSIS — G8929 Other chronic pain: Secondary | ICD-10-CM | POA: Diagnosis not present

## 2016-10-09 DIAGNOSIS — M545 Low back pain, unspecified: Secondary | ICD-10-CM

## 2016-10-09 NOTE — Progress Notes (Signed)
Office Visit Note   Patient: Jennifer Cruz           Date of Birth: Apr 08, 1931           MRN: [id number] Visit Date: 10/09/2016              Requested by: Jennifer Noble, MD [address] [place], [place] [address] PCP: Jennifer Noble, MD   Assessment & Plan: Visit Diagnoses: chronic low back pain with referred discomfort to left hip  Plan: restart low back exercises, Dr. Ernestina Cruz to evaluate for injections. Long discussion regarding back pathology  Follow-Up Instructions: No Follow-up on file.   Orders:  No orders of the defined types were placed in this encounter.  No orders of the defined types were placed in this encounter.     Procedures: No procedures performed   Clinical Data: No additional findings.   Subjective: Chief Complaint  Patient presents with  . Lower Back - Pain  . Left Hip - Pain    Jennifer Cruz  Presents with chronic lower back and bilateral hip pain that is becoming increasingly worse.Her husband states she received an "eipidural in her lower back in [place]. No record of that from [place] dictation. Her last visit in January was for her right shoulder and the cortisone injection provided relief.   MRI of the lumbar spine was performed in July 2017 and this demonstrated various levels of degenerative arthritic  particularlyat L5-S1 where there was 29mm anterolithesis Review of Systems   Objective: Vital Signs: There were no vitals taken for this visit.  Physical Exam  Ortho Exam straight leg raise negative bilaterally. Deep tendon reflexes equal bilaterally. Tenderness over both greater trochanters Specialty Comments:  No specialty comments available.  Imaging: No results found.   PMFS History: Patient Active Problem List   Diagnosis Date Noted  . Change in stool caliber 01/05/2015  . Encounter for screening colonoscopy 04/30/2012  . RLQ fullness 04/30/2012  . PALPITATIONS 02/23/2009  . VISUAL IMPAIRMENT 05/11/2008  . OSTEOARTHRITIS, KNEES,  BILATERAL 02/18/2008  . ELEVATED BLOOD PRESSURE WITHOUT DIAGNOSIS OF HYPERTENSION 11/17/2007  . MEMORY LOSS 08/17/2007  . MRI, BRAIN, ABNORMAL 08/17/2007  . OSTEOPENIA 07/08/2007  . ANXIETY DEPRESSION 05/08/2007  . HYPERLIPIDEMIA 04/24/2007  . DECREASED HEARING 04/24/2007  . PULMONARY NODULE 04/24/2007   Past Medical History:  Diagnosis Date  . Anxiety   . Depression   . Diverticulosis   . Dysrhythmia    palpitations  . Hyperlipidemia   . Hypertension   . Macular degeneration   . Memory loss    work up negative for Alzheimer's per medical records.  . Osteoarthritis   . Pulmonary nodules    per patient work-up completed at [facility] and no further evaluation needed    Family History  Problem Relation Age of Onset  . Diabetes Father   . Stroke Father   . Alzheimer's disease Mother     Past Surgical History:  Procedure Laterality Date  . BREAST ENHANCEMENT SURGERY    . CATARACT EXTRACTION  2009  . COLONOSCOPY WITH PROPOFOL N/A 09/17/2012   JenniferRourk- normal rectum, pancolonic diverticulosis: o/w the remainder of the colonic mucosa appeared normal. it is notable the left colon was somewhat stiff and noncompliant.   . incomplete colonoscopy  2005   inadequate sedation, [facility]  . REFRACTIVE SURGERY  2010  . TONSILLECTOMY    . TOTAL KNEE ARTHROPLASTY  05/2009   right   Social History   Occupational History  . Retired  Showed horses, artist   Social History Main Topics  . Smoking status: Former Smoker    Packs/day: 0.30    Years: 2.00    Types: Cigarettes    Quit date: 09/14/1952  . Smokeless tobacco: Never Used     Comment: quit in college  . Alcohol use 0.6 oz/week    1 Glasses of wine per week     Comment: a glass of red wine every night  . Drug use: No  . Sexual activity: Yes    Birth control/ protection: Post-menopausal

## 2016-10-18 ENCOUNTER — Telehealth (INDEPENDENT_AMBULATORY_CARE_PROVIDER_SITE_OTHER): Payer: Self-pay | Admitting: Orthopaedic Surgery

## 2016-10-18 NOTE — Telephone Encounter (Signed)
Patient called stating that Dr. Durward Fortes discussed with her on March 14 getting an epidural injection.  She has not heard anything, please give patient a call.  CB#[phone].  Thank you.

## 2016-10-21 ENCOUNTER — Other Ambulatory Visit (INDEPENDENT_AMBULATORY_CARE_PROVIDER_SITE_OTHER): Payer: Self-pay

## 2016-10-21 DIAGNOSIS — G8929 Other chronic pain: Secondary | ICD-10-CM

## 2016-10-21 DIAGNOSIS — M545 Low back pain: Principal | ICD-10-CM

## 2016-10-21 NOTE — Telephone Encounter (Signed)
Sent referral 

## 2016-10-31 ENCOUNTER — Encounter (INDEPENDENT_AMBULATORY_CARE_PROVIDER_SITE_OTHER): Payer: Self-pay | Admitting: Physical Medicine and Rehabilitation

## 2016-10-31 ENCOUNTER — Telehealth (INDEPENDENT_AMBULATORY_CARE_PROVIDER_SITE_OTHER): Payer: Self-pay | Admitting: Physical Medicine and Rehabilitation

## 2016-10-31 ENCOUNTER — Ambulatory Visit (INDEPENDENT_AMBULATORY_CARE_PROVIDER_SITE_OTHER): Payer: Commercial Managed Care - HMO | Admitting: Physical Medicine and Rehabilitation

## 2016-10-31 VITALS — BP 154/82 | HR 76

## 2016-10-31 DIAGNOSIS — M4316 Spondylolisthesis, lumbar region: Secondary | ICD-10-CM

## 2016-10-31 DIAGNOSIS — G8929 Other chronic pain: Secondary | ICD-10-CM | POA: Diagnosis not present

## 2016-10-31 DIAGNOSIS — M5442 Lumbago with sciatica, left side: Secondary | ICD-10-CM | POA: Diagnosis not present

## 2016-10-31 DIAGNOSIS — M47816 Spondylosis without myelopathy or radiculopathy, lumbar region: Secondary | ICD-10-CM

## 2016-10-31 DIAGNOSIS — M25552 Pain in left hip: Secondary | ICD-10-CM | POA: Diagnosis not present

## 2016-10-31 NOTE — Progress Notes (Signed)
Jennifer Cruz - 83 y.o. female MRN [id number]  Date of birth: 1931/05/08  Office Visit Note: Visit Date: 10/31/2016 PCP: Asencion Noble, MD Referred by: Asencion Noble, MD  Subjective: Chief Complaint  Patient presents with  . Lower Back - Pain   HPI: [name] is an 83 year old female with chronic history of low back pain and hip pain. She actually saw Dr. Durward Fortes in July of last year with several month onset of worsening back pain and hip pain that she felt was worse from lifting injury. At that time an MRI of the lumbar spine was performed and this is reviewed below. She is present with her husband who provided some of the history. She reports low back pain across the lower back worse with standing. Refers more on the left than right hip and sometimes in right hip. She denies any numbness tingling or paresthesia. Again some buttock pain and occasionally thigh pain down to the knee mostly in a L5 or S1-type distribution. It is more lateral than posterior but is very hard to get a consistent report from her. She is somewhat of a poor historian but does tell me she's had injections in her back before. They both think this was a [facility] but there are no reports of this on her old ankle record system or on [place]. The more I talk to her about this and I did find an x-ray, plain film of the lumbar spine which was ordered by Dr. Ellene Route they have determined that the last epidural was by Dr. Ellene Route. She states that the epidural she had several years ago worked but the last one did not work. She has not noted any focal weakness or bowel or bladder symptoms. No recent trauma. She has been taking over-the-counter ibuprofen 2 tablets 3 times per day with mild relief. Her cases, Katie by anxiety and depression and memory loss. She does take Aricept.    Review of Systems  Constitutional: Negative for chills, fever, malaise/fatigue and weight loss.  HENT: Negative for hearing loss and sinus pain.     Eyes: Negative for blurred vision, double vision and photophobia.  Respiratory: Negative for cough and shortness of breath.   Cardiovascular: Negative for chest pain, palpitations and leg swelling.  Gastrointestinal: Negative for abdominal pain, nausea and vomiting.  Genitourinary: Negative for flank pain.  Musculoskeletal: Positive for back pain and joint pain. Negative for myalgias.  Skin: Negative for itching and rash.  Neurological: Negative for tremors, focal weakness and weakness.  Endo/Heme/Allergies: Negative.   Psychiatric/Behavioral: Negative for depression.  All other systems reviewed and are negative.  Otherwise per HPI.  Assessment & Plan: Visit Diagnoses:  1. Chronic left-sided low back pain with left-sided sciatica   2. Pain in left hip   3. Spondylolisthesis of lumbar region   4. Spondylosis without myelopathy or radiculopathy, lumbar region     Plan: Findings:  Chronic worsening severe mostly axial left-sided low back and hip pain. She gets occasional pain in the thigh that some referral. MRI evidence from last year shows significant grade 1 listhesis with prominent left foraminal stenosis and facet arthropathy. There is no high-grade central stenosis. There are no disc herniations. Her pain has been chronic and long-standing. Is worse with activity and standing and this is more facet mediated pain. I think the best approach is diagnostic facet joint blocks at L5-S1 bilaterally. Depending on the relief she gets with that we would look one time and transforaminal approach at S1. We discussed  at length the Tarlov cyst. I would like to get Dr. Clarice Pole notes. I would not change her medications at this point. We did talk about activity modification. She does like to garden quite a bit and we did talk about what she should do while gardening in terms of boxing in the same position for very long. Physical therapy in the past and continues with exercises. I spent more than 25 minutes  speaking face-to-face with the patient with 50% of the time in counseling.    Meds & Orders: No orders of the defined types were placed in this encounter.  No orders of the defined types were placed in this encounter.   Follow-up: Return for Bilateral L5-S1 facet joint blocks.   Procedures: No procedures performed  No notes on file   Clinical History: Lumbar spine MRI dated 02/07/2016 There is significant grade 1 listhesis of L5 on S1 with surrounding endplate edema and possible compression of the endplate. There is no observable pars defect on the left and questionable on the right. She has facet arthropathy at L4-5 but without central canal narrowing that is severe at either level. 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 She also has very large Tarlov cyst greater on the right than left at S2.  She reports that she quit smoking about 64 years ago. Her smoking use included Cigarettes. She has a 0.60 pack-year smoking history. She has never used smokeless tobacco. No results for input(s): HGBA1C, LABURIC in the last 8760 hours.  Objective:  VS:  HT:    WT:   BMI:     BP:(!) 154/82  HR:76bpm  TEMP: ( )  RESP:  Physical Exam  Constitutional: She is oriented to person, place, and time. She appears well-developed and well-nourished. No distress.  HENT:  Head: Normocephalic and atraumatic.  Nose: Nose normal.  Mouth/Throat: Oropharynx is clear and moist.  Eyes: Conjunctivae are normal. Pupils are equal, round, and reactive to light.  Neck: Normal range of motion. Neck supple.  Cardiovascular: Regular rhythm and intact distal pulses.   Pulmonary/Chest: Effort normal and breath sounds normal.  Abdominal: She exhibits no distension.  Musculoskeletal:  Patient ambulates without aid she is slow to arise from a seated position. She has a forward flexed spine. She has pain with extension rotation left more than right. No pain with hip  rotation. No pain over the greater trochanters. Good distal strength without any deficits and no clonus bilaterally.  Neurological: She is alert and oriented to person, place, and time. She exhibits normal muscle tone. Coordination normal.  Skin: Skin is warm. No rash noted. No erythema.  Psychiatric: She has a normal mood and affect. Her behavior is normal.  Nursing note and vitals reviewed.   Ortho Exam Imaging: No results found.  Past Medical/Family/Surgical/Social History: Medications & Allergies reviewed per EMR Patient Active Problem List   Diagnosis Date Noted  . Chronic left-sided low back pain with left-sided sciatica 11/04/2016  . Pain in left hip 11/04/2016  . Spondylolisthesis of lumbar region 11/04/2016  . Spondylosis without myelopathy or radiculopathy, lumbar region 11/04/2016  . Change in stool caliber 01/05/2015  . Encounter for screening colonoscopy 04/30/2012  . RLQ fullness 04/30/2012  . PALPITATIONS 02/23/2009  . VISUAL IMPAIRMENT 05/11/2008  . OSTEOARTHRITIS, KNEES, BILATERAL 02/18/2008  . ELEVATED BLOOD PRESSURE WITHOUT DIAGNOSIS OF HYPERTENSION 11/17/2007  . MEMORY LOSS 08/17/2007  . MRI, BRAIN, ABNORMAL 08/17/2007  . OSTEOPENIA 07/08/2007  .  ANXIETY DEPRESSION 05/08/2007  . HYPERLIPIDEMIA 04/24/2007  . DECREASED HEARING 04/24/2007  . PULMONARY NODULE 04/24/2007   Past Medical History:  Diagnosis Date  . Anxiety   . Depression   . Diverticulosis   . Dysrhythmia    palpitations  . Hyperlipidemia   . Hypertension   . Macular degeneration   . Memory loss    work up negative for Alzheimer's per medical records.  . Osteoarthritis   . Pulmonary nodules    per patient work-up completed at [facility] and no further evaluation needed   Family History  Problem Relation Age of Onset  . Diabetes Father   . Stroke Father   . Alzheimer's disease Mother    Past Surgical History:  Procedure Laterality Date  . BREAST ENHANCEMENT SURGERY    . CATARACT  EXTRACTION  2009  . COLONOSCOPY WITH PROPOFOL N/A 09/17/2012   Dr.Rourk- normal rectum, pancolonic diverticulosis: o/w the remainder of the colonic mucosa appeared normal. it is notable the left colon was somewhat stiff and noncompliant.   . incomplete colonoscopy  2005   inadequate sedation, [facility]  . REFRACTIVE SURGERY  2010  . TONSILLECTOMY    . TOTAL KNEE ARTHROPLASTY  05/2009   right   Social History   Occupational History  . Retired     Patent attorney, Training and development officer   Social History Main Topics  . Smoking status: Former Smoker    Packs/day: 0.30    Years: 2.00    Types: Cigarettes    Quit date: 09/14/1952  . Smokeless tobacco: Never Used     Comment: quit in college  . Alcohol use 0.6 oz/week    1 Glasses of wine per week     Comment: a glass of red wine every night  . Drug use: No  . Sexual activity: Yes    Birth control/ protection: Post-menopausal

## 2016-11-04 DIAGNOSIS — M25552 Pain in left hip: Secondary | ICD-10-CM | POA: Insufficient documentation

## 2016-11-04 DIAGNOSIS — M47816 Spondylosis without myelopathy or radiculopathy, lumbar region: Secondary | ICD-10-CM | POA: Insufficient documentation

## 2016-11-04 DIAGNOSIS — M5442 Lumbago with sciatica, left side: Principal | ICD-10-CM

## 2016-11-04 DIAGNOSIS — G8929 Other chronic pain: Secondary | ICD-10-CM | POA: Insufficient documentation

## 2016-11-04 DIAGNOSIS — M4316 Spondylolisthesis, lumbar region: Secondary | ICD-10-CM | POA: Insufficient documentation

## 2016-11-05 NOTE — Telephone Encounter (Signed)
Faxed auth form with notes to [facility] 

## 2016-11-11 ENCOUNTER — Encounter (INDEPENDENT_AMBULATORY_CARE_PROVIDER_SITE_OTHER): Payer: Self-pay | Admitting: Physical Medicine and Rehabilitation

## 2016-11-11 ENCOUNTER — Ambulatory Visit (INDEPENDENT_AMBULATORY_CARE_PROVIDER_SITE_OTHER): Payer: Medicare HMO

## 2016-11-11 ENCOUNTER — Ambulatory Visit (INDEPENDENT_AMBULATORY_CARE_PROVIDER_SITE_OTHER): Payer: Medicare HMO | Admitting: Physical Medicine and Rehabilitation

## 2016-11-11 VITALS — BP 166/78 | HR 72 | Temp 98.0°F

## 2016-11-11 DIAGNOSIS — M47816 Spondylosis without myelopathy or radiculopathy, lumbar region: Secondary | ICD-10-CM

## 2016-11-11 MED ORDER — METHYLPREDNISOLONE ACETATE 80 MG/ML IJ SUSP
80.0000 mg | Freq: Once | INTRAMUSCULAR | Status: AC
  Administered 2016-11-11: 80 mg

## 2016-11-11 MED ORDER — LIDOCAINE HCL (PF) 1 % IJ SOLN
0.3300 mL | Freq: Once | INTRAMUSCULAR | Status: AC
  Administered 2016-11-11: 0.3 mL

## 2016-11-11 NOTE — Procedures (Signed)
Lumbar Facet Joint Intra-Articular Injection(s) with Fluoroscopic Guidance  Patient: Jennifer Cruz      Date of Birth: 02-15-1931 MRN: [id number] PCP: Asencion Noble, MD      Visit Date: 11/11/2016   Universal Protocol:    Date/Time: 11/12/1810:08 PM  Consent Given By: the patient  Position: PRONE   Additional Comments: Vital signs were monitored before and after the procedure. Patient was prepped and draped in the usual sterile fashion. The correct patient, procedure, and site was verified.   Injection Procedure Details:  Procedure Site One Meds Administered:  Meds ordered this encounter  Medications  . lidocaine (PF) (XYLOCAINE) 1 % injection 0.3 mL  . methylPREDNISolone acetate (DEPO-MEDROL) injection 80 mg     Laterality: Bilateral  Location/Site:  L5-S1  Needle size: 22 guage  Needle type: Spinal  Needle Placement: Articular  Findings:  -Contrast Used: 1 mL iohexol 180 mg iodine/mL   -Comments: Excellent flow of contrast producing a partial arthrogram.  Procedure Details: The fluoroscope beam is vertically oriented in AP, and the inferior recess is visualized beneath the lower pole of the inferior apophyseal process, which represents the target point for needle insertion. When direct visualization is difficult the target point is located at the medial projection of the vertebral pedicle. The region overlying each aforementioned target is locally anesthetized with a 1 to 2 ml. volume of 1% Lidocaine without Epinephrine.   The spinal needle was inserted into each of the above mentioned facet joints using biplanar fluoroscopic guidance. A 0.25 to 0.5 ml. volume of Isovue-250 was injected and a partial facet joint arthrogram was obtained. A single spot film was obtained of the resulting arthrogram.    One to 1.25 ml of the steroid/anesthetic solution was then injected into each of the facet joints noted above.   Additional Comments:  The patient tolerated the  procedure well Dressing: Band-Aid    Post-procedure details: Patient was observed during the procedure. Post-procedure instructions were reviewed.  Patient left the clinic in stable condition.

## 2016-11-11 NOTE — Patient Instructions (Signed)

## 2016-11-11 NOTE — Telephone Encounter (Signed)
VGCY#[id number]. eff 11/05/16-12/20/16.

## 2016-11-11 NOTE — Progress Notes (Signed)
Jennifer Cruz - 83 y.o. female MRN [id number]  Date of birth: 20-Jun-1931  Office Visit Note: Visit Date: 11/11/2016 PCP: Asencion Noble, MD Referred by: Asencion Noble, MD  Subjective: Chief Complaint  Patient presents with  . Lower Back - Pain   HPI: [name] is a very pleasant 83 year old female with chronic worsening axial low back pain with complication of memory impairment. She comes in today for planned bilateral L5-S1 facet joint blocks. Please see our prior evaluation and management note for further details and justification.     Planned bilateral L5-S1 facets. Pain has increased, otherwise symptoms unchanged.  ROS Otherwise per HPI.  Assessment & Plan: Visit Diagnoses:  1. Spondylosis without myelopathy or radiculopathy, lumbar region     Plan: Findings:  Bilateral L5-S1 facet joint block.    Meds & Orders:  Meds ordered this encounter  Medications  . lidocaine (PF) (XYLOCAINE) 1 % injection 0.3 mL  . methylPREDNISolone acetate (DEPO-MEDROL) injection 80 mg    Orders Placed This Encounter  Procedures  . Facet Injection  . XR C-ARM NO REPORT    Follow-up: Return if symptoms worsen or fail to improve.   Procedures: No procedures performed  Lumbar Facet Joint Intra-Articular Injection(s) with Fluoroscopic Guidance  Patient: Jennifer Cruz      Date of Birth: May 06, 1931 MRN: [id number] PCP: Asencion Noble, MD      Visit Date: 11/11/2016   Universal Protocol:    Date/Time: 11/12/1810:08 PM  Consent Given By: the patient  Position: PRONE   Additional Comments: Vital signs were monitored before and after the procedure. Patient was prepped and draped in the usual sterile fashion. The correct patient, procedure, and site was verified.   Injection Procedure Details:  Procedure Site One Meds Administered:  Meds ordered this encounter  Medications  . lidocaine (PF) (XYLOCAINE) 1 % injection 0.3 mL  . methylPREDNISolone acetate (DEPO-MEDROL) injection 80 mg      Laterality: Bilateral  Location/Site:  L5-S1  Needle size: 22 guage  Needle type: Spinal  Needle Placement: Articular  Findings:  -Contrast Used: 1 mL iohexol 180 mg iodine/mL   -Comments: Excellent flow of contrast producing a partial arthrogram.  Procedure Details: The fluoroscope beam is vertically oriented in AP, and the inferior recess is visualized beneath the lower pole of the inferior apophyseal process, which represents the target point for needle insertion. When direct visualization is difficult the target point is located at the medial projection of the vertebral pedicle. The region overlying each aforementioned target is locally anesthetized with a 1 to 2 ml. volume of 1% Lidocaine without Epinephrine.   The spinal needle was inserted into each of the above mentioned facet joints using biplanar fluoroscopic guidance. A 0.25 to 0.5 ml. volume of Isovue-250 was injected and a partial facet joint arthrogram was obtained. A single spot film was obtained of the resulting arthrogram.    One to 1.25 ml of the steroid/anesthetic solution was then injected into each of the facet joints noted above.   Additional Comments:  The patient tolerated the procedure well Dressing: Band-Aid    Post-procedure details: Patient was observed during the procedure. Post-procedure instructions were reviewed.  Patient left the clinic in stable condition.     Clinical History: Lumbar spine MRI dated 02/07/2016 There is significant grade 1 listhesis of L5 on S1 with surrounding endplate edema and possible compression of the endplate. There is no observable pars defect on the left and questionable on the right. She has facet arthropathy  at L4-5 but without central canal narrowing that is severe at either level. 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 She also has very large Tarlov cyst greater on the right than left at S2.  She  reports that she quit smoking about 64 years ago. Her smoking use included Cigarettes. She has a 0.60 pack-year smoking history. She has never used smokeless tobacco. No results for input(s): HGBA1C, LABURIC in the last 8760 hours.  Objective:  VS:  HT:    WT:   BMI:     BP:(!) 166/78  HR:72bpm  TEMP:98 F (36.7 C)(Oral)  RESP:100 % Physical Exam  Musculoskeletal:  Patient ambulates without aid with good distal strength.    Ortho Exam Imaging: Xr C-arm No Report  Result Date: 11/11/2016 Please see Notes or Procedures tab for imaging impression.   Past Medical/Family/Surgical/Social History: Medications & Allergies reviewed per EMR Patient Active Problem List   Diagnosis Date Noted  . Chronic left-sided low back pain with left-sided sciatica 11/04/2016  . Pain in left hip 11/04/2016  . Spondylolisthesis of lumbar region 11/04/2016  . Spondylosis without myelopathy or radiculopathy, lumbar region 11/04/2016  . Change in stool caliber 01/05/2015  . Encounter for screening colonoscopy 04/30/2012  . RLQ fullness 04/30/2012  . PALPITATIONS 02/23/2009  . VISUAL IMPAIRMENT 05/11/2008  . OSTEOARTHRITIS, KNEES, BILATERAL 02/18/2008  . ELEVATED BLOOD PRESSURE WITHOUT DIAGNOSIS OF HYPERTENSION 11/17/2007  . MEMORY LOSS 08/17/2007  . MRI, BRAIN, ABNORMAL 08/17/2007  . OSTEOPENIA 07/08/2007  . ANXIETY DEPRESSION 05/08/2007  . HYPERLIPIDEMIA 04/24/2007  . DECREASED HEARING 04/24/2007  . PULMONARY NODULE 04/24/2007   Past Medical History:  Diagnosis Date  . Anxiety   . Depression   . Diverticulosis   . Dysrhythmia    palpitations  . Hyperlipidemia   . Hypertension   . Macular degeneration   . Memory loss    work up negative for Alzheimer's per medical records.  . Osteoarthritis   . Pulmonary nodules    per patient work-up completed at [facility] and no further evaluation needed   Family History  Problem Relation Age of Onset  . Diabetes Father   . Stroke Father   .  Alzheimer's disease Mother    Past Surgical History:  Procedure Laterality Date  . BREAST ENHANCEMENT SURGERY    . CATARACT EXTRACTION  2009  . COLONOSCOPY WITH PROPOFOL N/A 09/17/2012   Dr.Rourk- normal rectum, pancolonic diverticulosis: o/w the remainder of the colonic mucosa appeared normal. it is notable the left colon was somewhat stiff and noncompliant.   . incomplete colonoscopy  2005   inadequate sedation, [facility]  . REFRACTIVE SURGERY  2010  . TONSILLECTOMY    . TOTAL KNEE ARTHROPLASTY  05/2009   right   Social History   Occupational History  . Retired     Patent attorney, Training and development officer   Social History Main Topics  . Smoking status: Former Smoker    Packs/day: 0.30    Years: 2.00    Types: Cigarettes    Quit date: 09/14/1952  . Smokeless tobacco: Never Used     Comment: quit in college  . Alcohol use 0.6 oz/week    1 Glasses of wine per week     Comment: a glass of red wine every night  . Drug use: No  . Sexual activity: Yes    Birth control/ protection: Post-menopausal

## 2016-11-13 DIAGNOSIS — K006 Disturbances in tooth eruption: Secondary | ICD-10-CM | POA: Diagnosis not present

## 2016-12-02 ENCOUNTER — Ambulatory Visit: Payer: Commercial Managed Care - HMO | Admitting: Neurology

## 2016-12-05 ENCOUNTER — Telehealth (INDEPENDENT_AMBULATORY_CARE_PROVIDER_SITE_OTHER): Payer: Self-pay | Admitting: Physical Medicine and Rehabilitation

## 2016-12-05 NOTE — Telephone Encounter (Signed)
Repeat but may consider both L4-5 and L5-S1 then look at RFA

## 2016-12-05 NOTE — Telephone Encounter (Signed)
[employer] auth for bilateral [phone], possible bilateral [id number]. Patient aware we will call to schedule after we get auth.

## 2016-12-05 NOTE — Telephone Encounter (Signed)
Faxed auth form and last 2 office notes to [employer] [phone]

## 2016-12-10 NOTE — Telephone Encounter (Signed)
Patient called to schedule her injection. I called her back and again explained that we are waiting on authorization from her insurance.

## 2016-12-12 NOTE — Telephone Encounter (Signed)
Received vm from [facility] stating injection was approved for code [id number]. She did not have auth # handy but said she would be faxing letter. Tried to call pt for scheduling and rang several times and then busy signal.

## 2016-12-13 NOTE — Telephone Encounter (Signed)
Scheduled for 12/16/16 at [place] with driver.

## 2016-12-16 ENCOUNTER — Ambulatory Visit (INDEPENDENT_AMBULATORY_CARE_PROVIDER_SITE_OTHER): Payer: Medicare HMO | Admitting: Physical Medicine and Rehabilitation

## 2016-12-16 ENCOUNTER — Ambulatory Visit (INDEPENDENT_AMBULATORY_CARE_PROVIDER_SITE_OTHER): Payer: Medicare HMO

## 2016-12-16 ENCOUNTER — Encounter (INDEPENDENT_AMBULATORY_CARE_PROVIDER_SITE_OTHER): Payer: Self-pay | Admitting: Physical Medicine and Rehabilitation

## 2016-12-16 VITALS — BP 133/65 | HR 71 | Temp 98.0°F

## 2016-12-16 DIAGNOSIS — M47816 Spondylosis without myelopathy or radiculopathy, lumbar region: Secondary | ICD-10-CM

## 2016-12-16 MED ORDER — METHYLPREDNISOLONE ACETATE 80 MG/ML IJ SUSP
80.0000 mg | Freq: Once | INTRAMUSCULAR | Status: AC
  Administered 2016-12-16: 80 mg

## 2016-12-16 MED ORDER — LIDOCAINE HCL (PF) 1 % IJ SOLN
2.0000 mL | Freq: Once | INTRAMUSCULAR | Status: AC
  Administered 2016-12-16: 2 mL

## 2016-12-16 NOTE — Patient Instructions (Signed)

## 2016-12-16 NOTE — Progress Notes (Deleted)
Pain across lower back and seems to be worse on the left side.  No leg pain. States she did have relief with last injection. Possibly 70 % relief for several weeks and then gradually getting back the way it was. Pain Still not as bad as she was prior to the last injection.

## 2016-12-17 NOTE — Procedures (Signed)
Lumbar Facet Joint Intra-Articular Injection(s) with Fluoroscopic Guidance  Patient: Jennifer Cruz      Date of Birth: 03-08-1931 MRN: [id number] PCP: Asencion Noble, MD      Visit Date: 12/16/2016   Universal Protocol:    Date/Time: 05/22/185:39 AM  Consent Given By: the patient  Position: PRONE   Additional Comments: Vital signs were monitored before and after the procedure. Patient was prepped and draped in the usual sterile fashion. The correct patient, procedure, and site was verified.   Injection Procedure Details:  Procedure Site One Meds Administered:  Meds ordered this encounter  Medications  . lidocaine (PF) (XYLOCAINE) 1 % injection 2 mL  . methylPREDNISolone acetate (DEPO-MEDROL) injection 80 mg     Laterality: Bilateral  Location/Site:  L4-L5 L5-S1  Needle size: 22 guage  Needle type: Spinal  Needle Placement: Articular  Findings:  -Contrast Used: 1 mL iohexol 180 mg iodine/mL   -Comments: Excellent flow of contrast producing a partial arthrogram.  Procedure Details: The fluoroscope beam is vertically oriented in AP, and the inferior recess is visualized beneath the lower pole of the inferior apophyseal process, which represents the target point for needle insertion. When direct visualization is difficult the target point is located at the medial projection of the vertebral pedicle. The region overlying each aforementioned target is locally anesthetized with a 1 to 2 ml. volume of 1% Lidocaine without Epinephrine.   The spinal needle was inserted into each of the above mentioned facet joints using biplanar fluoroscopic guidance. A 0.25 to 0.5 ml. volume of Isovue-250 was injected and a partial facet joint arthrogram was obtained. A single spot film was obtained of the resulting arthrogram.    One to 1.25 ml of the steroid/anesthetic solution was then injected into each of the facet joints noted above.   Additional Comments:  No complications  occurred Dressing: Band-Aid    Post-procedure details: Patient was observed during the procedure. Post-procedure instructions were reviewed.  Patient left the clinic in stable condition.

## 2016-12-17 NOTE — Progress Notes (Signed)
Jennifer Cruz - 83 y.o. female MRN [id number]  Date of birth: 06/10/1931  Office Visit Note: Visit Date: 12/16/2016 PCP: Asencion Noble, MD Referred by: Asencion Noble, MD  Subjective: Chief Complaint  Patient presents with  . Lower Back - Pain   HPI: Jennifer Cruz is an 83 year old female who is accompanied by her husband who provided some of the history. They come in today for follow-up from bilateral L5-S1 facet joint block. She states that she got probably 70% relief for several weeks and then gradually the pain returned. Her husband initially feels like she didn't get that much relief. After long discussion with and it was determined that she was getting around better getting up from a seated position much better. She reports now pain across lower back bilaterally left worse than right. It is at the belt line and a little bit below. She reports the pain really still is not as bad as she was prior to the last injection but is been gradually getting back to the same spot. She does a lot of gardening. They have a 200 acre home and his story home and she does a lot of gardening. We talked at length about what she does during gardening. They had a multitude of questions that basically rehashed the prior evaluation but we did follow through with those questions again today. Mainly asking about what she should and shouldn't do. Asking about a corset that she does wear while she is gardening. Also asking about a device they ordered that looks like a cuing device to help you stand up more straight. Her husband is worrying about the fact that she will lean over when she is standing or walking and it hurts. She denies any symptoms of claudication or leg pain. She does get upper buttock pain. She denies any focal weakness. MRI findings again are reviewed below and showed grade 1 listhesis of L5 on S1 with edema in this area but there is also severe facet disease at L4-5. Symptoms are better at rest unless she sits  for a long time. Symptoms are worse with standing.    Review of Systems  Constitutional: Negative for chills, fever, malaise/fatigue and weight loss.  HENT: Negative for hearing loss and sinus pain.   Eyes: Negative for blurred vision, double vision and photophobia.  Respiratory: Negative for cough and shortness of breath.   Cardiovascular: Negative for chest pain, palpitations and leg swelling.  Gastrointestinal: Negative for abdominal pain, nausea and vomiting.  Genitourinary: Negative for flank pain.  Musculoskeletal: Positive for back pain. Negative for myalgias.  Skin: Negative for itching and rash.  Neurological: Negative for tremors, focal weakness and weakness.  Endo/Heme/Allergies: Negative.   Psychiatric/Behavioral: Negative for depression.  All other systems reviewed and are negative.  Otherwise per HPI.  Assessment & Plan: Visit Diagnoses:  1. Spondylosis without myelopathy or radiculopathy, lumbar region     Plan: Findings:  Chronic worsening axial low back pain which seems to be facet joint mediated pain. Again I'll over a lot of questions today that and answers for them. I spoke with them at length probably for 25-30 minutes. Her main question at times was just what is her condition which we've gone over at length now at least twice. She does have a lot of arthritis in the lower spine and I think that is causing a lot of her symptoms. She clearly has some mechanical symptoms as well. She doesn't seem to have much in the way of  radicular type pain. Injections were diagnostic she got more than 70% relief. I think it would be rise to repeat those in a double diagnostic fashion and included the L4-5 region as well. Depending on those results we could look at possible facet joint ablation. This will give her longer-term relief hopefully. I did tell her was located wear the corset while she was gardening but to try to minimize the time that she wears at. She is not using any strong  medications and I would be hesitant to use any opioids in this situation due to potential for falling. I spent more than 25 minutes speaking face-to-face with the patient with 50% of the time in counseling. The injection was performed today.    Meds & Orders:  Meds ordered this encounter  Medications  . lidocaine (PF) (XYLOCAINE) 1 % injection 2 mL  . methylPREDNISolone acetate (DEPO-MEDROL) injection 80 mg    Orders Placed This Encounter  Procedures  . Facet Injection  . XR C-ARM NO REPORT    Follow-up: Return if symptoms worsen or fail to improve, for Consider Radiofrequency Ablation.   Procedures: No procedures performed  Lumbar Facet Joint Intra-Articular Injection(s) with Fluoroscopic Guidance  Patient: Jennifer Cruz      Date of Birth: [birth year] MRN: [id number] PCP: Asencion Noble, MD      Visit Date: 12/16/2016   Universal Protocol:    Date/Time: 05/22/185:39 AM  Consent Given By: the patient  Position: PRONE   Additional Comments: Vital signs were monitored before and after the procedure. Patient was prepped and draped in the usual sterile fashion. The correct patient, procedure, and site was verified.   Injection Procedure Details:  Procedure Site One Meds Administered:  Meds ordered this encounter  Medications  . lidocaine (PF) (XYLOCAINE) 1 % injection 2 mL  . methylPREDNISolone acetate (DEPO-MEDROL) injection 80 mg     Laterality: Bilateral  Location/Site:  L4-L5 L5-S1  Needle size: 22 guage  Needle type: Spinal  Needle Placement: Articular  Findings:  -Contrast Used: 1 mL iohexol 180 mg iodine/mL   -Comments: Excellent flow of contrast producing a partial arthrogram.  Procedure Details: The fluoroscope beam is vertically oriented in AP, and the inferior recess is visualized beneath the lower pole of the inferior apophyseal process, which represents the target point for needle insertion. When direct visualization is difficult the target  point is located at the medial projection of the vertebral pedicle. The region overlying each aforementioned target is locally anesthetized with a 1 to 2 ml. volume of 1% Lidocaine without Epinephrine.   The spinal needle was inserted into each of the above mentioned facet joints using biplanar fluoroscopic guidance. A 0.25 to 0.5 ml. volume of Isovue-250 was injected and a partial facet joint arthrogram was obtained. A single spot film was obtained of the resulting arthrogram.    One to 1.25 ml of the steroid/anesthetic solution was then injected into each of the facet joints noted above.   Additional Comments:  No complications occurred Dressing: Band-Aid    Post-procedure details: Patient was observed during the procedure. Post-procedure instructions were reviewed.  Patient left the clinic in stable condition.     Clinical History: Lumbar spine MRI dated 02/07/2016 There is significant grade 1 listhesis of L5 on S1 with surrounding endplate edema and possible compression of the endplate. There is no observable pars defect on the left and questionable on the right. She has facet arthropathy at L4-5 but without central canal narrowing that is severe  at either level. 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 She also has very large Tarlov cyst greater on the right than left at S2.  She reports that she quit smoking about 64 years ago. Her smoking use included Cigarettes. She has a 0.60 pack-year smoking history. She has never used smokeless tobacco. No results for input(s): HGBA1C, LABURIC in the last 8760 hours.  Objective:  VS:  HT:    WT:   BMI:     BP:133/65  HR:71bpm  TEMP:98 F (36.7 C)( )  RESP:100 % Physical Exam  Constitutional: She is oriented to person, place, and time. She appears well-developed and well-nourished.  Eyes: Conjunctivae and EOM are normal. Pupils are equal, round, and reactive to light.    Cardiovascular: Normal rate and intact distal pulses.   Pulmonary/Chest: Effort normal.  Musculoskeletal:  Patient ambulates without aid. She does have concordant pain with extension rotation of the lumbar spine. No pain with hip rotation she has good distal strength bilaterally. She has no clonus bilaterally. She has no pain over the greater trochanters.  Neurological: She is alert and oriented to person, place, and time. She exhibits normal muscle tone. Coordination normal.  Skin: Skin is warm and dry. No rash noted. No erythema.  Psychiatric: She has a normal mood and affect. Her behavior is normal.  Nursing note and vitals reviewed.   Ortho Exam Imaging: Xr C-arm No Report  Result Date: 12/16/2016 Please see Notes or Procedures tab for imaging impression.   Past Medical/Family/Surgical/Social History: Medications & Allergies reviewed per EMR Patient Active Problem List   Diagnosis Date Noted  . Chronic left-sided low back pain with left-sided sciatica 11/04/2016  . Pain in left hip 11/04/2016  . Spondylolisthesis of lumbar region 11/04/2016  . Spondylosis without myelopathy or radiculopathy, lumbar region 11/04/2016  . Change in stool caliber 01/05/2015  . Encounter for screening colonoscopy 04/30/2012  . RLQ fullness 04/30/2012  . PALPITATIONS 02/23/2009  . VISUAL IMPAIRMENT 05/11/2008  . OSTEOARTHRITIS, KNEES, BILATERAL 02/18/2008  . ELEVATED BLOOD PRESSURE WITHOUT DIAGNOSIS OF HYPERTENSION 11/17/2007  . MEMORY LOSS 08/17/2007  . MRI, BRAIN, ABNORMAL 08/17/2007  . OSTEOPENIA 07/08/2007  . ANXIETY DEPRESSION 05/08/2007  . HYPERLIPIDEMIA 04/24/2007  . DECREASED HEARING 04/24/2007  . PULMONARY NODULE 04/24/2007   Past Medical History:  Diagnosis Date  . Anxiety   . Depression   . Diverticulosis   . Dysrhythmia    palpitations  . Hyperlipidemia   . Hypertension   . Macular degeneration   . Memory loss    work up negative for Alzheimer's per medical records.  .  Osteoarthritis   . Pulmonary nodules    per patient work-up completed at [facility] and no further evaluation needed   Family History  Problem Relation Age of Onset  . Diabetes Father   . Stroke Father   . Alzheimer's disease Mother    Past Surgical History:  Procedure Laterality Date  . BREAST ENHANCEMENT SURGERY    . CATARACT EXTRACTION  2009  . COLONOSCOPY WITH PROPOFOL N/A 09/17/2012   Dr.Rourk- normal rectum, pancolonic diverticulosis: o/w the remainder of the colonic mucosa appeared normal. it is notable the left colon was somewhat stiff and noncompliant.   . incomplete colonoscopy  2005   inadequate sedation, [facility]  . REFRACTIVE SURGERY  2010  . TONSILLECTOMY    . TOTAL KNEE ARTHROPLASTY  05/2009   right   Social History   Occupational History  .  Retired     Patent attorney, Training and development officer   Social History Main Topics  . Smoking status: Former Smoker    Packs/day: 0.30    Years: 2.00    Types: Cigarettes    Quit date: 09/14/1952  . Smokeless tobacco: Never Used     Comment: quit in college  . Alcohol use 0.6 oz/week    1 Glasses of wine per week     Comment: a glass of red wine every night  . Drug use: No  . Sexual activity: Yes    Birth control/ protection: Post-menopausal

## 2016-12-19 ENCOUNTER — Telehealth: Payer: Self-pay | Admitting: Neurology

## 2016-12-19 DIAGNOSIS — L0202 Furuncle of face: Secondary | ICD-10-CM | POA: Diagnosis not present

## 2016-12-19 NOTE — Telephone Encounter (Signed)
I tried to call the daughter, the number listed is not operational.  I will address this issue when I see the patient next, the patient may require medication for depression.  If the daughter calls back, we need to make sure that we get a telephone number that is operational.

## 2016-12-19 NOTE — Telephone Encounter (Signed)
Pt's daughter said she is in denial of the dementia, she does not speak of it. She talked with her parents today and her mother said she hates herself and feels she can do nothing. She is wanting to know if her mother can be treated for the depression and anxiety. Please call to discuss, daughter will not be able to attend the appt on 12/25/16.

## 2016-12-20 NOTE — Telephone Encounter (Signed)
**  Pt Daughter made aware that you will not return to office until 12-24-2016** Pt daughter called because she did not receive a call back I informed her of the issue with the telephone # she is asking to be called on [phone]

## 2016-12-22 NOTE — Telephone Encounter (Signed)
I called the daughter. There appear to be some issues with depression and anxiety. We will address this on the next revisit.  The patient continues to drive a car, on her last visit, she had a mild dementia we will need to reassess this issue with each revisit.

## 2016-12-25 ENCOUNTER — Ambulatory Visit (INDEPENDENT_AMBULATORY_CARE_PROVIDER_SITE_OTHER): Payer: Medicare HMO | Admitting: Neurology

## 2016-12-25 ENCOUNTER — Encounter: Payer: Self-pay | Admitting: Neurology

## 2016-12-25 VITALS — BP 157/75 | HR 76 | Ht 62.0 in | Wt 125.0 lb

## 2016-12-25 DIAGNOSIS — R413 Other amnesia: Secondary | ICD-10-CM

## 2016-12-25 MED ORDER — MEMANTINE HCL-DONEPEZIL HCL 7 & 14 & 21 &28 -10 MG PO C4PK: 1.0000 | EXTENDED_RELEASE_CAPSULE | Freq: Every day | ORAL | 0 refills | Status: DC

## 2016-12-25 NOTE — Patient Instructions (Signed)
   We will start Namzaric as a dose pack, once the dosepack is finished, call our office for a prescription.

## 2016-12-25 NOTE — Progress Notes (Signed)
Reason for visit: Memory disturbance  Jennifer Cruz is an 83 y.o. female  History of present illness:  Jennifer Cruz is an 83 year old right-handed white female with a history of a progressive memory disturbance. MRI of the brain showed cortical atrophy that could be consistent with Alzheimer's disease. The patient is on Aricept, she is tolerating the medication well. The patient has not had a significant change in memory since last seen. She is operating a motor vehicle without difficulty, she has not given up any activities of daily living because of memory. She is sleeping well at night, she has had a 3 pound weight loss on the Aricept since last seen. She has a good energy level. There is some problems with anxiety regarding her memory loss. The patient will repeat herself frequently.  Past Medical History:  Diagnosis Date  . Anxiety   . Depression   . Diverticulosis   . Dysrhythmia    palpitations  . Hyperlipidemia   . Hypertension   . Macular degeneration   . Memory loss    work up negative for Alzheimer's per medical records.  . Osteoarthritis   . Pulmonary nodules    per patient work-up completed at [facility] and no further evaluation needed    Past Surgical History:  Procedure Laterality Date  . BREAST ENHANCEMENT SURGERY    . CATARACT EXTRACTION  2009  . COLONOSCOPY WITH PROPOFOL N/A 09/17/2012   Dr.Rourk- normal rectum, pancolonic diverticulosis: o/w the remainder of the colonic mucosa appeared normal. it is notable the left colon was somewhat stiff and noncompliant.   . incomplete colonoscopy  2005   inadequate sedation, [facility]  . REFRACTIVE SURGERY  2010  . TONSILLECTOMY    . TOTAL KNEE ARTHROPLASTY  05/2009   right    Family History  Problem Relation Age of Onset  . Diabetes Father   . Stroke Father   . Alzheimer's disease Mother     Social history:  reports that she quit smoking about 64 years ago. Her smoking use included Cigarettes. She has a 0.60 pack-year  smoking history. She has never used smokeless tobacco. She reports that she drinks about 0.6 oz of alcohol per week . She reports that she does not use drugs.    Allergies  Allergen Reactions  . Celecoxib Hives  . Amitriptyline Hcl     REACTION: Palpatations  . Prochlorperazine Edisylate     REACTION: Used for nausea and developed tardive dyskinesia    Medications:  Prior to Admission medications   Medication Sig Start Date End Date Taking? Authorizing Provider  B Complex Vitamins (B COMPLEX PO) daily. 05/28/16  Yes [provider]  CALCIUM CITRATE-VITAMIN D PO daily. 05/28/16  Yes [provider]  donepezil (ARICEPT) 10 MG tablet Take 1 tablet (10 mg total) by mouth at bedtime. 07/10/16  Yes Kathrynn Ducking, MD  fish oil-omega-3 fatty acids 1000 MG capsule Take 2 g by mouth daily.   Yes [provider]  ibuprofen (ADVIL,MOTRIN) 100 MG tablet Take 100 mg by mouth every 4 (four) hours as needed for fever.   Yes [provider]  ipratropium (ATROVENT) 0.06 % nasal spray  03/11/16  Yes [provider]  Multiple Vitamins-Minerals (MULTIVITAMIN WITH MINERALS) tablet Take 1 tablet by mouth daily.   Yes [provider]  Vitamins A & D 5000-400 units CAPS  05/28/16  Yes [provider]    ROS:  Out of a complete 14 system review of symptoms,  the patient complains only of the following symptoms, and all other reviewed systems are negative.  Joint pain Restless legs Decreased concentration, memory disturbance  Blood pressure (!) 157/75, pulse 76, height 5\' 2"  (1.575 m), weight 125 lb (56.7 kg).  Physical Exam  General: The patient is alert and cooperative at the time of the examination.  Skin: No significant peripheral edema is noted.   Neurologic Exam  Mental status: The patient is alert and oriented x 3 at the time of the examination. The patient has apparent normal recent and remote memory, with an apparently normal  attention span and concentration ability. Mini-Mental Status Examination done today shows a total score 28/30.   Cranial nerves: Facial symmetry is present. Speech is normal, no aphasia or dysarthria is noted. Extraocular movements are full. Visual fields are full.  Motor: The patient has good strength in all 4 extremities.  Sensory examination: Soft touch sensation is symmetric on the face, arms, and legs.  Coordination: The patient has good finger-nose-finger and heel-to-shin bilaterally.  Gait and station: The patient has a normal gait. Tandem gait is normal. Romberg is negative. No drift is seen.  Reflexes: Deep tendon reflexes are symmetric.   MRI brain 06/12/16:  IMPRESSION:  Abnormal MRI brain (without) demonstrating: 1. Moderate parietal and moderate-severe anterior and mesial temporal atrophy. 2. Few scattered periventricular and subcortical foci of non-specific gliosis. 3. No acute findings. 4. Compared to MRI on 07/13/07, there has been significant progression of atrophy.    Assessment/Plan:  1. Progressive memory disturbance  The patient is doing well on Aricept, we will add Namenda to this, she will be switched to Namzaric. A Dosepak prescription was given, she will call for a maintenance dose prescription if tolerated. The patient is interested in learning more about memory research.  Greater than 50% of the office visit was spent with counseling with the patient and her husband. 35 minutes were spent face-to-face.  Jennifer Alexanders MD 12/25/2016 8:55 AM  [facility] [address] [place], [place] [address]  Phone [phone] Fax [phone]

## 2017-02-12 ENCOUNTER — Telehealth: Payer: Self-pay | Admitting: Neurology

## 2017-02-12 MED ORDER — MEMANTINE HCL-DONEPEZIL HCL ER 28-10 MG PO CP24: 1.0000 | ORAL_CAPSULE | Freq: Every day | ORAL | 3 refills | Status: DC

## 2017-02-12 NOTE — Telephone Encounter (Signed)
Pt calling for a refill of Memantine HCl-Donepezil HCl (NAMZARIC) 7 & 14 & 21 &28 -10 MG C4PK, pt states she just completed week 4 . Please send to  [place] [place] [facility] [place]. Rut[address] Mannan [phone] (Phone) [phone] (Fax)

## 2017-02-12 NOTE — Telephone Encounter (Signed)
I called in a prescription for Namzaric.

## 2017-02-12 NOTE — Addendum Note (Signed)
Addended by: Kathrynn Ducking on: 02/12/2017 06:11 PM   Modules accepted: Orders

## 2017-02-24 ENCOUNTER — Telehealth: Payer: Self-pay | Admitting: Neurology

## 2017-02-24 MED ORDER — MEMANTINE HCL-DONEPEZIL HCL ER 28-10 MG PO CP24: 1.0000 | ORAL_CAPSULE | Freq: Every day | ORAL | 3 refills | Status: DC

## 2017-02-24 NOTE — Telephone Encounter (Signed)
Re-faxed rx namzaric to fax below as requested. Received confirmation.

## 2017-02-24 NOTE — Addendum Note (Signed)
Addended by: Hope Pigeon on: 02/24/2017 04:10 PM   Modules accepted: Orders

## 2017-02-24 NOTE — Telephone Encounter (Signed)
Pt husband asking for a call from [facility] re: the supplemental carrier for pt's medication

## 2017-02-24 NOTE — Telephone Encounter (Signed)
Printed rx, awaiting MD signature  

## 2017-02-24 NOTE — Telephone Encounter (Signed)
Patient's husband calling back with correct fax#. It is [phone].

## 2017-02-24 NOTE — Telephone Encounter (Signed)
Called husband back. He wanted rx namzaric re-sent to [facility] delivery. He gave fax number: [phone]. I advised this is not a correct number for a fax. He is going to call [employer] back and find out correct fax number and call back to let us know.

## 2017-02-26 ENCOUNTER — Ambulatory Visit (INDEPENDENT_AMBULATORY_CARE_PROVIDER_SITE_OTHER): Payer: Medicare HMO | Admitting: Orthopaedic Surgery

## 2017-03-05 ENCOUNTER — Ambulatory Visit (INDEPENDENT_AMBULATORY_CARE_PROVIDER_SITE_OTHER): Payer: Medicare HMO | Admitting: Orthopaedic Surgery

## 2017-03-05 DIAGNOSIS — G8929 Other chronic pain: Secondary | ICD-10-CM | POA: Diagnosis not present

## 2017-03-05 DIAGNOSIS — M25512 Pain in left shoulder: Secondary | ICD-10-CM

## 2017-03-05 MED ORDER — BUPIVACAINE HCL 0.25 % IJ SOLN
2.0000 mL | INTRAMUSCULAR | Status: AC | PRN
  Administered 2017-03-05: 2 mL via INTRA_ARTICULAR

## 2017-03-05 MED ORDER — METHYLPREDNISOLONE ACETATE 40 MG/ML IJ SUSP
80.0000 mg | INTRAMUSCULAR | Status: AC | PRN
  Administered 2017-03-05: 80 mg

## 2017-03-05 MED ORDER — LIDOCAINE HCL 1 % IJ SOLN
2.0000 mL | INTRAMUSCULAR | Status: AC | PRN
  Administered 2017-03-05: 2 mL

## 2017-03-05 MED ORDER — BUPIVACAINE HCL 0.5 % IJ SOLN
2.0000 mL | INTRAMUSCULAR | Status: AC | PRN
  Administered 2017-03-05: 2 mL via INTRA_ARTICULAR

## 2017-03-05 NOTE — Progress Notes (Signed)
Office Visit Note   Patient: Jennifer Cruz           Date of Birth: 1931-01-14           MRN: [id number] Visit Date: 03/05/2017              Requested by: Asencion Noble, MD [address] [place], [place] [address] PCP: Asencion Noble, MD   Assessment & Plan: Visit Diagnoses:  1. Chronic left shoulder pain   Rotator cuff arthropathy left shoulder with high riding humeral head and degenerative arthritis  Plan: Long discussion regarding diagnosis. Jennifer Cruz has been experiencing pain for 3-4 months and thinks it may be related to her work in her yard. She would like to try a cortisone injection which I will performed today and follow-up as needed . she is aware of the diagnosis and what she can expect over time. She was not interested in physical therapy or a surgical option at this time  Follow-Up Instructions: Return if symptoms worsen or fail to improve.   Orders:  Orders Placed This Encounter  Procedures  . XR Shoulder 1V Left   No orders of the defined types were placed in this encounter.     Procedures: Large Joint Inj Date/Time: 03/05/2017 8:28 PM Performed by: Garald Balding Authorized by: Garald Balding   Consent Given by:  Patient Timeout: prior to procedure the correct patient, procedure, and site was verified   Indications:  Pain Location:  Shoulder Site:  L glenohumeral Prep: patient was prepped and draped in usual sterile fashion   Needle Size:  25 G Needle Length:  1.5 inches Approach:  Posterior Ultrasound Guidance: No   Fluoroscopic Guidance: No   Arthrogram: No   Medications:  2 mL lidocaine 1 %; 2 mL bupivacaine 0.5 %; 80 mg methylPREDNISolone acetate 40 MG/ML; 2 mL bupivacaine 0.25 % Aspiration Attempted: No   Patient tolerance:  Patient tolerated the procedure well with no immediate complications     Clinical Data: No additional findings.   Subjective: No chief complaint on file. Jennifer Cruz initially experienced insidious  onset of left shoulder pain approximately 3-4 months ago. He denies any obvious injury or trauma but had been involved in a lot of lifting and working at Jennifer Cruz in her yard. She has developed limited overhead range of motion and some activity modification related to her pain. She's had some trouble sleeping on that side. She Is "thinking it would simply get better". She's tried heating pad and over-the-counter medicines but continues to have compromise of her activities. She is left hand nondominant area G denies any elbow hand or hand pain. She denies any numbness or tingling in the last upper extremity, shortness of breath or chest pain.  HPI  Review of Systems   Objective: Vital Signs: There were no vitals taken for this visit.  Physical Exam  Ortho Exam right shoulder with painful overhead arc of motion. It was very deliberate and secured [place]. Considerable weakness with external rotation and mild weakness with internal rotation. Skin intact neurovascular exam is intact. No crepitation. No obvious swelling. Biceps intact. No pain at the acromioclavicular joint or subacromial region.  Specialty Comments:  No specialty comments available.  Imaging: Xr Shoulder 1v Left  Result Date: 03/05/2017 Films of the left shoulder obtained in 2 projections. There is evidence of rotator cuff arthropathy. The humeral head is superiorly migrated with less than 5 mm between the humeral head and the acromion. No  ectopic calcification. Small inferior humeral head spur. Mild degenerative changes at the acromioclavicular joint. No evidence of fracture. Some bony demineralization    PMFS History: Patient Active Problem List   Diagnosis Date Noted  . Chronic left-sided low back pain with left-sided sciatica 11/04/2016  . Pain in left hip 11/04/2016  . Spondylolisthesis of lumbar region 11/04/2016  . Spondylosis without myelopathy or radiculopathy, lumbar region 11/04/2016  . Change in stool caliber  01/05/2015  . Encounter for screening colonoscopy 04/30/2012  . RLQ fullness 04/30/2012  . PALPITATIONS 02/23/2009  . VISUAL IMPAIRMENT 05/11/2008  . OSTEOARTHRITIS, Jennifer Cruz 02/18/2008  . ELEVATED BLOOD PRESSURE WITHOUT DIAGNOSIS OF HYPERTENSION 11/17/2007  . MEMORY LOSS 08/17/2007  . MRI, BRAIN, ABNORMAL 08/17/2007  . OSTEOPENIA 07/08/2007  . ANXIETY DEPRESSION 05/08/2007  . HYPERLIPIDEMIA 04/24/2007  . DECREASED HEARING 04/24/2007  . PULMONARY NODULE 04/24/2007   Past Medical History:  Diagnosis Date  . Anxiety   . Depression   . Diverticulosis   . Dysrhythmia    palpitations  . Hyperlipidemia   . Hypertension   . Macular degeneration   . Memory loss    work up negative for Alzheimer's per medical records.  . Osteoarthritis   . Pulmonary nodules    per patient work-up completed at [facility] and no further evaluation needed    Family History  Problem Relation Age of Onset  . Diabetes Father   . Stroke Father   . Alzheimer's disease Mother     Past Surgical History:  Procedure Laterality Date  . BREAST ENHANCEMENT SURGERY    . CATARACT EXTRACTION  2009  . COLONOSCOPY WITH PROPOFOL N/A 09/17/2012   Dr.Rourk- normal rectum, pancolonic diverticulosis: o/w the remainder of the colonic mucosa appeared normal. it is notable the left colon was somewhat stiff and noncompliant.   . incomplete colonoscopy  2005   inadequate sedation, [facility]  . REFRACTIVE SURGERY  2010  . TONSILLECTOMY    . TOTAL KNEE ARTHROPLASTY  05/2009   right   Social History   Occupational History  . Retired     Patent attorney, Training and development officer   Social History Main Topics  . Smoking status: Former Smoker    Packs/day: 0.30    Years: 2.00    Types: Cigarettes    Quit date: 09/14/1952  . Smokeless tobacco: Never Used     Comment: quit in college  . Alcohol use 0.6 oz/week    1 Glasses of wine per week     Comment: a glass of red wine every night  . Drug use: No  . Sexual activity: Yes    Birth  control/ protection: Post-menopausal     Garald Balding, MD   Note - This record has been created using [employer].  Chart creation errors have been sought, but may not always  have been located. Such creation errors do not reflect on  the standard of medical care.

## 2017-03-06 ENCOUNTER — Ambulatory Visit (INDEPENDENT_AMBULATORY_CARE_PROVIDER_SITE_OTHER): Payer: Medicare HMO

## 2017-03-06 DIAGNOSIS — M25512 Pain in left shoulder: Secondary | ICD-10-CM

## 2017-03-06 DIAGNOSIS — G8929 Other chronic pain: Secondary | ICD-10-CM

## 2017-03-13 ENCOUNTER — Telehealth: Payer: Self-pay | Admitting: Neurology

## 2017-03-13 NOTE — Telephone Encounter (Signed)
I called the daughter. The patient is having some problems with agitation at times. She is having mood swings and anger. She has no realization of her cognitive difficulties.  We can consider addition of Lexapro, she will be seen in September, we will discuss this then.  The daughter indicates that the patient is having significant issues with directions with driving, she is concerned about safety issues.  It may be time to curtail driving as well, but this is likely to induce significant agitation on the part of the patient.  I will have to discuss this with the patient on the visit in September.

## 2017-03-13 NOTE — Telephone Encounter (Signed)
Pt's daughter/Paige called said her father was set up to go to support group meeting several times but those have fallen thru. She said the pt is in denial she has anything wrong with her and she gets angry if he tries to help her. Said the patient is depressed, has anxiety, she is driving, mood swings, anger issues, can not handle financial issues but she thinks she can. Pt thinks daughter does not know her dx. She said her father is 24/7 caregiver but "he is out of his element" with her. Please call her at [phone]) or [phone] (h).

## 2017-04-16 ENCOUNTER — Ambulatory Visit (INDEPENDENT_AMBULATORY_CARE_PROVIDER_SITE_OTHER): Payer: Medicare HMO | Admitting: Neurology

## 2017-04-16 ENCOUNTER — Encounter: Payer: Self-pay | Admitting: Neurology

## 2017-04-16 DIAGNOSIS — G301 Alzheimer's disease with late onset: Secondary | ICD-10-CM

## 2017-04-16 DIAGNOSIS — G309 Alzheimer's disease, unspecified: Secondary | ICD-10-CM

## 2017-04-16 DIAGNOSIS — F0281 Dementia in other diseases classified elsewhere with behavioral disturbance: Secondary | ICD-10-CM

## 2017-04-16 DIAGNOSIS — F02818 Dementia in other diseases classified elsewhere, unspecified severity, with other behavioral disturbance: Secondary | ICD-10-CM

## 2017-04-16 DIAGNOSIS — F0280 Dementia in other diseases classified elsewhere without behavioral disturbance: Secondary | ICD-10-CM

## 2017-04-16 HISTORY — DX: Dementia in other diseases classified elsewhere, unspecified severity, without behavioral disturbance, psychotic disturbance, mood disturbance, and anxiety: F02.80

## 2017-04-16 MED ORDER — ESCITALOPRAM OXALATE 5 MG PO TABS: 5.0000 mg | ORAL_TABLET | Freq: Every day | ORAL | 3 refills | Status: DC

## 2017-04-16 NOTE — Progress Notes (Signed)
Reason for visit: Alzheimer's disease  Jennifer Cruz is an 83 y.o. female  History of present illness:  Ms. Warth is an 84 year old right-handed white female with a history of a progressive memory disturbance. The patient has had some mild progression of memory since last seen, she is on Namzaric, she is tolerating the medication well, she has lost 3 pounds since last seen. The patient is having some problems with agitation. She operates a Teacher, music, she claims that she does not have problems with directions. She does not travel long distances or go on [employer]. She sometimes will forget things at the store, and forget to do certain chores. She is trying to stay fairly organized and is relatively good at this. She will make lists of tasks that need to get done during the day. The patient is usually sleeping fairly well at night. She returns to this office for an evaluation. Initially, the patient and her husband were not interested in research for memory disturbances, they have changed their mind at this time.   Past Medical History:  Diagnosis Date  . Alzheimer disease 04/16/2017  . Anxiety   . Depression   . Diverticulosis   . Dysrhythmia    palpitations  . Hyperlipidemia   . Hypertension   . Macular degeneration   . Memory loss    work up negative for Alzheimer's per medical records.  . Osteoarthritis   . Pulmonary nodules    per patient work-up completed at [facility] and no further evaluation needed    Past Surgical History:  Procedure Laterality Date  . BREAST ENHANCEMENT SURGERY    . CATARACT EXTRACTION  2009  . COLONOSCOPY WITH PROPOFOL N/A 09/17/2012   Dr.Rourk- normal rectum, pancolonic diverticulosis: o/w the remainder of the colonic mucosa appeared normal. it is notable the left colon was somewhat stiff and noncompliant.   . incomplete colonoscopy  2005   inadequate sedation, [facility]  . REFRACTIVE SURGERY  2010  . TONSILLECTOMY    . TOTAL KNEE ARTHROPLASTY   05/2009   right    Family History  Problem Relation Age of Onset  . Diabetes Father   . Stroke Father   . Alzheimer's disease Mother     Social history:  reports that she quit smoking about 64 years ago. Her smoking use included Cigarettes. She has a 0.60 pack-year smoking history. She has never used smokeless tobacco. She reports that she drinks about 0.6 oz of alcohol per week . She reports that she does not use drugs.    Allergies  Allergen Reactions  . Celecoxib Hives  . Amitriptyline Hcl     REACTION: Palpatations  . Prochlorperazine Edisylate     REACTION: Used for nausea and developed tardive dyskinesia    Medications:  Prior to Admission medications   Medication Sig Start Date End Date Taking? Authorizing Provider  B Complex Vitamins (B COMPLEX PO) daily. 05/28/16  Yes [provider]  CALCIUM CITRATE-VITAMIN D PO daily. 05/28/16  Yes [provider]  fish oil-omega-3 fatty acids 1000 MG capsule Take 2 g by mouth daily.   Yes [provider]  ibuprofen (ADVIL,MOTRIN) 100 MG tablet Take 100 mg by mouth every 4 (four) hours as needed for fever.   Yes [provider]  ipratropium (ATROVENT) 0.06 % nasal spray  03/11/16  Yes [provider]  Memantine HCl-Donepezil HCl (NAMZARIC) 28-10 MG CP24 Take 1 capsule by mouth daily. 02/24/17  Yes Kathrynn Ducking, MD  Multiple Vitamins-Minerals (MULTIVITAMIN WITH MINERALS) tablet Take 1 tablet by mouth daily.   Yes [provider]  Vitamins A & D 5000-400 units CAPS  05/28/16  Yes [provider]    ROS:  Out of a complete 14 system review of symptoms, the patient complains only of the following symptoms, and all other reviewed systems are negative.  Food allergies Muscle cramps Memory loss Decreased concentration, depression Joint pain  Blood pressure (!) 150/70, pulse 72, height 5\' 2"  (1.575 m), weight 122 lb (55.3 kg), SpO2 98 %.  Physical Exam  General: The  patient is alert and cooperative at the time of the examination.  Skin: No significant peripheral edema is noted.   Neurologic Exam  Mental status: The patient is alert and oriented x 3 at the time of the examination. The patient has apparent normal recent and remote memory, with an apparently normal attention span and concentration ability. Mini-Mental Status Examination done today shows a total score 29/30.   Cranial nerves: Facial symmetry is present. Speech is normal, no aphasia or dysarthria is noted. Extraocular movements are full. Visual fields are full.  Motor: The patient has good strength in all 4 extremities.  Sensory examination: Soft touch sensation is symmetric on the face, arms, and legs.  Coordination: The patient has good finger-nose-finger and heel-to-shin bilaterally. No evidence of apraxia is seen.  Gait and station: The patient has a normal gait. Tandem gait is normal. Romberg is negative. No drift is seen.  Reflexes: Deep tendon reflexes are symmetric.   Assessment/Plan:  1. Memory disturbance  The patient will be given a trial on low-dose Lexapro taking 5 mg daily for her agitation. The patient will continue the Namzaric, she will follow-up in 6 months. They are interested in learning more about research for memory disorders.   Jill Alexanders MD 04/16/2017 8:10 AM  [facility] [address] [place], [place] [address]  Phone [phone] Fax [phone]

## 2017-04-16 NOTE — Progress Notes (Signed)
Called and spoke with Mitzi Hansen at [facility] and cx rx lexapro. E-scribed to [facility] in [place], [place] per patient request instead.

## 2017-04-16 NOTE — Patient Instructions (Signed)
   We will start Lexapro 5 mg daily.

## 2017-04-25 ENCOUNTER — Telehealth: Payer: Self-pay | Admitting: Neurology

## 2017-04-25 NOTE — Telephone Encounter (Signed)
The patient has sent in a note indicating that she did not take the Lexapro for fear of side effects.

## 2017-05-14 ENCOUNTER — Ambulatory Visit (INDEPENDENT_AMBULATORY_CARE_PROVIDER_SITE_OTHER): Payer: Medicare HMO | Admitting: Orthopaedic Surgery

## 2017-05-14 DIAGNOSIS — E785 Hyperlipidemia, unspecified: Secondary | ICD-10-CM | POA: Diagnosis not present

## 2017-05-14 DIAGNOSIS — K219 Gastro-esophageal reflux disease without esophagitis: Secondary | ICD-10-CM | POA: Diagnosis not present

## 2017-05-14 DIAGNOSIS — I69111 Memory deficit following nontraumatic intracerebral hemorrhage: Secondary | ICD-10-CM | POA: Diagnosis not present

## 2017-05-14 DIAGNOSIS — R7301 Impaired fasting glucose: Secondary | ICD-10-CM | POA: Diagnosis not present

## 2017-05-14 DIAGNOSIS — Z79899 Other long term (current) drug therapy: Secondary | ICD-10-CM | POA: Diagnosis not present

## 2017-05-21 ENCOUNTER — Ambulatory Visit (INDEPENDENT_AMBULATORY_CARE_PROVIDER_SITE_OTHER): Payer: Medicare HMO

## 2017-05-21 ENCOUNTER — Ambulatory Visit (INDEPENDENT_AMBULATORY_CARE_PROVIDER_SITE_OTHER): Payer: Medicare HMO | Admitting: Orthopaedic Surgery

## 2017-05-21 ENCOUNTER — Encounter (INDEPENDENT_AMBULATORY_CARE_PROVIDER_SITE_OTHER): Payer: Self-pay | Admitting: Orthopaedic Surgery

## 2017-05-21 VITALS — Resp 12 | Ht 64.0 in | Wt 125.0 lb

## 2017-05-21 DIAGNOSIS — M5440 Lumbago with sciatica, unspecified side: Secondary | ICD-10-CM | POA: Diagnosis not present

## 2017-05-21 DIAGNOSIS — M25552 Pain in left hip: Secondary | ICD-10-CM

## 2017-05-21 DIAGNOSIS — M25551 Pain in right hip: Secondary | ICD-10-CM

## 2017-05-21 NOTE — Progress Notes (Signed)
Office Visit Note   Patient: Jennifer Cruz           Date of Birth: June 10, 1931           MRN: [id number] Visit Date: 05/21/2017              Requested by: Asencion Noble, MD [address] [place], [place] [address] PCP: Asencion Noble, MD   Assessment & Plan: Visit Diagnoses:  1. Hip pain, bilateral   2. Low back pain with sciatica, sciatica laterality unspecified, unspecified back pain laterality, unspecified chronicity     Plan: No obvious change in films of lumbar spine compared to prior films. Believe her pain is related to the L5-S1 joint space with the spondylolisthesis and facet arthritis. She had good relief with her pain after Dr. Ernestina Patches injected her back in May. We'll have Dr. Ernestina Patches reevaluate. Consider using her back brace Follow-Up Instructions: Return if symptoms worsen or fail to improve.   Orders:  Orders Placed This Encounter  Procedures  . XR Lumbar Spine 2-3 Views  . XR HIPS BILAT W OR W/O PELVIS 2V   No orders of the defined types were placed in this encounter.     Procedures: No procedures performed   Clinical Data: No additional findings.   Subjective: Chief Complaint  Patient presents with  . Lower Back - Pain  . Right Hip - Pain    Jennifer Cruz is  83 y o t hat is here for chronic lower back and bilateral hip pain. Pt states no  radiation to legs or  knees but the pain runs from her lower back into the buttocks, x 6 months.  . Left Hip - Pain  Jennifer Cruz does not have any recent history of injury or trauma. She has chronic back pain which has been previously diagnosed by MRI scan and plain film. There were numerous degenerative changes between L3 and the sacrum. There is also complete collapse of the L5-S1 disc space with a grade 2 spondylolisthesis. She responded about 5 months ago to facet joint injections by Dr. Ernestina Patches. She's had slow recurrence of her pain probably related to her active lifestyle particularly with gardening  HPI  Review  of Systems  Constitutional: Negative for chills, fatigue and fever.  Eyes: Negative for itching.  Respiratory: Negative for chest tightness and shortness of breath.   Cardiovascular: Negative for chest pain, palpitations and leg swelling.  Gastrointestinal: Negative for blood in stool, constipation and diarrhea.  Endocrine: Negative for polyuria.  Genitourinary: Negative for dysuria.  Musculoskeletal: Positive for back pain. Negative for joint swelling, neck pain and neck stiffness.  Allergic/Immunologic: Negative for immunocompromised state.  Neurological: Negative for dizziness and numbness.  Hematological: Does not bruise/bleed easily.  Psychiatric/Behavioral: Positive for confusion. The patient is not nervous/anxious.      Objective: Vital Signs: Resp 12   Ht 5\' 4"  (1.626 m)   Wt 125 lb (56.7 kg)   BMI 21.46 kg/m   Physical Exam  Ortho Exam awake alert and oriented. Great leg raise negative bilaterally. Painless range of motion both hips with internal/external rotation. No swelling distally. Walks without a limp. Diffuse tenderness of lower lumbar spine. Pelvis appeared to be level. No sacroiliac joint discomfort  Specialty Comments:  No specialty comments available.  Imaging: Xr Hips Bilat W Or W/o Pelvis 2v  Result Date: 05/21/2017 [facility] the pelvis and lateral both hips were obtained. A very minor degenerative changes in both hips. The joint spaces are  well maintained. Some mild ectopic calcification within the joint on the right . Minimal osteophyte formation.  Xr Lumbar Spine 2-3 Views  Result Date: 05/21/2017 Films of lumbar spine were obtained in 2 projections. There is a grade 2 spondylolisthesis of L5 on S1 with complete collapse of the disc space. There are facet joint changes of arthritis at L3-4 L4-5 and L5-S1. Diffuse calcification of the abdominal aorta my measurement is at greatest 3 cm area and no evidence of a compression fracture. I compared the films to those  that were done 2017 and don't see any significant difference    PMFS History: Patient Active Problem List   Diagnosis Date Noted  . Alzheimer disease 04/16/2017  . Chronic left-sided low back pain with left-sided sciatica 11/04/2016  . Pain in left hip 11/04/2016  . Spondylolisthesis of lumbar region 11/04/2016  . Spondylosis without myelopathy or radiculopathy, lumbar region 11/04/2016  . Change in stool caliber 01/05/2015  . Encounter for screening colonoscopy 04/30/2012  . RLQ fullness 04/30/2012  . PALPITATIONS 02/23/2009  . VISUAL IMPAIRMENT 05/11/2008  . OSTEOARTHRITIS, KNEES, BILATERAL 02/18/2008  . ELEVATED BLOOD PRESSURE WITHOUT DIAGNOSIS OF HYPERTENSION 11/17/2007  . MEMORY LOSS 08/17/2007  . MRI, BRAIN, ABNORMAL 08/17/2007  . OSTEOPENIA 07/08/2007  . ANXIETY DEPRESSION 05/08/2007  . HYPERLIPIDEMIA 04/24/2007  . DECREASED HEARING 04/24/2007  . PULMONARY NODULE 04/24/2007   Past Medical History:  Diagnosis Date  . Alzheimer disease 04/16/2017  . Anxiety   . Depression   . Diverticulosis   . Dysrhythmia    palpitations  . Hyperlipidemia   . Hypertension   . Macular degeneration   . Memory loss    work up negative for Alzheimer's per medical records.  . Osteoarthritis   . Pulmonary nodules    per patient work-up completed at [facility] and no further evaluation needed    Family History  Problem Relation Age of Onset  . Diabetes Father   . Stroke Father   . Alzheimer's disease Mother     Past Surgical History:  Procedure Laterality Date  . BREAST ENHANCEMENT SURGERY    . CATARACT EXTRACTION  2009  . COLONOSCOPY WITH PROPOFOL N/A 09/17/2012   Dr.Rourk- normal rectum, pancolonic diverticulosis: o/w the remainder of the colonic mucosa appeared normal. it is notable the left colon was somewhat stiff and noncompliant.   . incomplete colonoscopy  2005   inadequate sedation, [facility]  . REFRACTIVE SURGERY  2010  . TONSILLECTOMY    . TOTAL KNEE ARTHROPLASTY  05/2009    right   Social History   Occupational History  . Retired     Patent attorney, Training and development officer   Social History Main Topics  . Smoking status: Former Smoker    Packs/day: 0.30    Years: 2.00    Types: Cigarettes    Quit date: 09/14/1952  . Smokeless tobacco: Never Used     Comment: quit in college  . Alcohol use 0.6 oz/week    1 Glasses of wine per week     Comment: a glass of red wine every night  . Drug use: No  . Sexual activity: Yes    Birth control/ protection: Post-menopausal

## 2017-05-22 ENCOUNTER — Other Ambulatory Visit (HOSPITAL_COMMUNITY): Payer: Self-pay | Admitting: Internal Medicine

## 2017-05-22 DIAGNOSIS — Z23 Encounter for immunization: Secondary | ICD-10-CM | POA: Diagnosis not present

## 2017-05-22 DIAGNOSIS — Z1231 Encounter for screening mammogram for malignant neoplasm of breast: Secondary | ICD-10-CM

## 2017-05-22 DIAGNOSIS — G3184 Mild cognitive impairment, so stated: Secondary | ICD-10-CM | POA: Diagnosis not present

## 2017-05-22 DIAGNOSIS — Z6822 Body mass index (BMI) 22.0-22.9, adult: Secondary | ICD-10-CM | POA: Diagnosis not present

## 2017-05-22 DIAGNOSIS — R7301 Impaired fasting glucose: Secondary | ICD-10-CM | POA: Diagnosis not present

## 2017-05-22 DIAGNOSIS — Z0001 Encounter for general adult medical examination with abnormal findings: Secondary | ICD-10-CM | POA: Diagnosis not present

## 2017-05-26 ENCOUNTER — Telehealth (INDEPENDENT_AMBULATORY_CARE_PROVIDER_SITE_OTHER): Payer: Self-pay | Admitting: Physical Medicine and Rehabilitation

## 2017-05-26 NOTE — Telephone Encounter (Signed)
Yes okay 

## 2017-05-26 NOTE — Telephone Encounter (Signed)
Needs auth for bilateral [id number] and [phone]. Scheduled for 06/09/17 pending auth.

## 2017-05-26 NOTE — Telephone Encounter (Signed)
Created referral for procedure. Faxed [facility] auth form along with Dr. Romona Curls 12/16/16 office note to [phone].

## 2017-05-28 ENCOUNTER — Ambulatory Visit (HOSPITAL_COMMUNITY): Payer: Commercial Managed Care - HMO

## 2017-05-28 DIAGNOSIS — H524 Presbyopia: Secondary | ICD-10-CM | POA: Diagnosis not present

## 2017-05-28 DIAGNOSIS — H353222 Exudative age-related macular degeneration, left eye, with inactive choroidal neovascularization: Secondary | ICD-10-CM | POA: Diagnosis not present

## 2017-06-03 ENCOUNTER — Ambulatory Visit (INDEPENDENT_AMBULATORY_CARE_PROVIDER_SITE_OTHER): Payer: Commercial Managed Care - HMO | Admitting: Orthopaedic Surgery

## 2017-06-03 ENCOUNTER — Encounter (INDEPENDENT_AMBULATORY_CARE_PROVIDER_SITE_OTHER): Payer: Self-pay | Admitting: Orthopaedic Surgery

## 2017-06-03 ENCOUNTER — Ambulatory Visit (INDEPENDENT_AMBULATORY_CARE_PROVIDER_SITE_OTHER): Payer: Commercial Managed Care - HMO

## 2017-06-03 VITALS — BP 165/77 | HR 72 | Resp 14 | Ht 63.0 in | Wt 125.0 lb

## 2017-06-03 DIAGNOSIS — M25511 Pain in right shoulder: Secondary | ICD-10-CM

## 2017-06-03 MED ORDER — METHYLPREDNISOLONE ACETATE 40 MG/ML IJ SUSP
80.0000 mg | INTRAMUSCULAR | Status: AC | PRN
  Administered 2017-06-03: 80 mg

## 2017-06-03 MED ORDER — LIDOCAINE HCL 2 % IJ SOLN
2.0000 mL | INTRAMUSCULAR | Status: AC | PRN
  Administered 2017-06-03: 2 mL

## 2017-06-03 MED ORDER — BUPIVACAINE HCL 0.5 % IJ SOLN
2.0000 mL | INTRAMUSCULAR | Status: AC | PRN
Start: 2017-06-03 — End: 2017-06-03
  Administered 2017-06-03: 2 mL via INTRA_ARTICULAR

## 2017-06-03 NOTE — Progress Notes (Signed)
Office Visit Note   Patient: Jennifer Cruz           Date of Birth: [birth year]           MRN: [id number] Visit Date: 06/03/2017              Requested by: Asencion Noble, MD [address] [place],  [address] PCP: Asencion Noble, MD   Assessment & Plan: Visit Diagnoses:  1. Acute pain of right shoulder     Plan: Possible rotator cuff tear. Will inject the subacromial space and monitor response. Long discussion with Mr. Jennifer Cruz about the diagnostic possibilities  Follow-Up Instructions: Return if symptoms worsen or fail to improve.   Orders:  Orders Placed This Encounter  Procedures  . XR Shoulder Right   No orders of the defined types were placed in this encounter.     Procedures: Large Joint Inj: R subacromial bursa on 06/03/2017 5:06 PM Indications: pain and diagnostic evaluation Details: 25 G 1.5 in needle, anterolateral approach  Arthrogram: No  Medications: 2 mL lidocaine 2 %; 2 mL bupivacaine 0.5 %; 80 mg methylPREDNISolone acetate 40 MG/ML Consent was given by the patient. Immediately prior to procedure a time out was called to verify the correct patient, procedure, equipment, support staff and site/side marked as required. Patient was prepped and draped in the usual sterile fashion.       Clinical Data: No additional findings.   Subjective: Chief Complaint  Patient presents with  . Right Shoulder - Pain, Injury    Jennifer Cruz is an 83 y o that is here b/c she fell going downstairs when she grabbed onto the banister to stop herself and she felt a pain in her R shoulder.  Acute onset of right shoulder pain the end of last week. No ecchymosis or skin changes. Having some pain with overhead range of motion.  HPI  Review of Systems  Constitutional: Negative for chills, fatigue and fever.  Eyes: Negative for itching.  Respiratory: Negative for chest tightness and shortness of breath.   Cardiovascular: Negative for chest pain, palpitations and  leg swelling.  Gastrointestinal: Negative for blood in stool, constipation and diarrhea.  Endocrine: Negative for polyuria.  Genitourinary: Negative for dysuria.  Musculoskeletal: Positive for back pain. Negative for joint swelling, neck pain and neck stiffness.  Allergic/Immunologic: Negative for immunocompromised state.  Neurological: Negative for dizziness and numbness.  Hematological: Does not bruise/bleed easily.  Psychiatric/Behavioral: The patient is not nervous/anxious.      Objective: Vital Signs: BP (!) 165/77   Pulse 72   Resp 14   Ht 5\' 3"  (1.6 m)   Wt 125 lb (56.7 kg)   BMI 22.14 kg/m   Physical Exam  Ortho Exam awake alert and oriented 3. Comfortable sitting. Able to place a right arm fully overhead but with a painful arc of motion. Skin intact. Some pain in the anterior subacromial region. Biceps intact. Good grip and good release. Good strength with internal/external rotation  Specialty Comments:  No specialty comments available.  Imaging: Xr Shoulder Right  Result Date: 06/03/2017 Films of the right shoulder obtained in several projections. There is a decreased space between the humeral head and the acromion. Some degenerative changes at the acromioclavicular joint. No evidence of fracture or dislocation. The humeral head is slightly high rising. No evidence of degenerative changes of the glenohumeral joint. findings may be consistent with chronic rotator cuff tear    PMFS History: Patient Active Problem List  Diagnosis Date Noted  . Alzheimer disease 04/16/2017  . Chronic left-sided low back pain with left-sided sciatica 11/04/2016  . Pain in left hip 11/04/2016  . Spondylolisthesis of lumbar region 11/04/2016  . Spondylosis without myelopathy or radiculopathy, lumbar region 11/04/2016  . Change in stool caliber 01/05/2015  . Encounter for screening colonoscopy 04/30/2012  . RLQ fullness 04/30/2012  . PALPITATIONS 02/23/2009  . VISUAL IMPAIRMENT  05/11/2008  . OSTEOARTHRITIS, KNEES, BILATERAL 02/18/2008  . ELEVATED BLOOD PRESSURE WITHOUT DIAGNOSIS OF HYPERTENSION 11/17/2007  . MEMORY LOSS 08/17/2007  . MRI, BRAIN, ABNORMAL 08/17/2007  . OSTEOPENIA 07/08/2007  . ANXIETY DEPRESSION 05/08/2007  . HYPERLIPIDEMIA 04/24/2007  . DECREASED HEARING 04/24/2007  . PULMONARY NODULE 04/24/2007   Past Medical History:  Diagnosis Date  . Alzheimer disease 04/16/2017  . Anxiety   . Depression   . Diverticulosis   . Dysrhythmia    palpitations  . Hyperlipidemia   . Hypertension   . Macular degeneration   . Memory loss    work up negative for Alzheimer's per medical records.  . Osteoarthritis   . Pulmonary nodules    per patient work-up completed at [facility] and no further evaluation needed    Family History  Problem Relation Age of Onset  . Diabetes Father   . Stroke Father   . Alzheimer's disease Mother     Past Surgical History:  Procedure Laterality Date  . BREAST ENHANCEMENT SURGERY    . CATARACT EXTRACTION  2009  . incomplete colonoscopy  2005   inadequate sedation, [facility]  . REFRACTIVE SURGERY  2010  . TONSILLECTOMY    . TOTAL KNEE ARTHROPLASTY  05/2009   right   Social History   Occupational History  . Occupation: Retired    Comment: Patent attorney, Training and development officer  Tobacco Use  . Smoking status: Former Smoker    Packs/day: 0.30    Years: 2.00    Pack years: 0.60    Types: Cigarettes    Last attempt to quit: 09/14/1952    Years since quitting: 64.7  . Smokeless tobacco: Never Used  . Tobacco comment: quit in college  Substance and Sexual Activity  . Alcohol use: Yes    Alcohol/week: 0.6 oz    Types: 1 Glasses of wine per week    Comment: a glass of red wine every night  . Drug use: No  . Sexual activity: Yes    Birth control/protection: Post-menopausal

## 2017-06-03 NOTE — Telephone Encounter (Signed)
Received auth for [id number] and [phone]. eff 05/26/17-07/10/17. Auth #[id number]. Pt already scheduled. See referral.

## 2017-06-09 ENCOUNTER — Encounter (INDEPENDENT_AMBULATORY_CARE_PROVIDER_SITE_OTHER): Payer: Medicare HMO | Admitting: Physical Medicine and Rehabilitation

## 2017-06-18 ENCOUNTER — Telehealth (INDEPENDENT_AMBULATORY_CARE_PROVIDER_SITE_OTHER): Payer: Self-pay

## 2017-06-18 NOTE — Telephone Encounter (Signed)
Patients husband called stating that patient would like to schedule an appointment for an MRI.  Stated that she had done something to her arm/shoulder.  CB# is [phone]. Please advise.

## 2017-06-23 ENCOUNTER — Other Ambulatory Visit (INDEPENDENT_AMBULATORY_CARE_PROVIDER_SITE_OTHER): Payer: Self-pay

## 2017-06-23 ENCOUNTER — Telehealth (INDEPENDENT_AMBULATORY_CARE_PROVIDER_SITE_OTHER): Payer: Self-pay | Admitting: Orthopaedic Surgery

## 2017-06-23 DIAGNOSIS — G8929 Other chronic pain: Secondary | ICD-10-CM

## 2017-06-23 DIAGNOSIS — M25511 Pain in right shoulder: Principal | ICD-10-CM

## 2017-06-23 NOTE — Telephone Encounter (Signed)
Patient's husband calling to let doctor know patient would like to schedule MRI of her rt shoulder before rov. Patient's husband asking for this to be scheduled ASAP. Please call to advise. Husband will cancel appt in [place] for 11/28 until after MRI.

## 2017-06-23 NOTE — Telephone Encounter (Signed)
Referral sent to [facility]

## 2017-06-26 ENCOUNTER — Telehealth (INDEPENDENT_AMBULATORY_CARE_PROVIDER_SITE_OTHER): Payer: Self-pay | Admitting: *Deleted

## 2017-06-26 NOTE — Telephone Encounter (Signed)
Pt has MRI appt scheduled at [facility] on Mon Dec 3 at 4pm, pt is to arrive 15 mins early to register. IC pt left message on vm to return call for appt information.

## 2017-06-30 ENCOUNTER — Ambulatory Visit (HOSPITAL_COMMUNITY)
Admission: RE | Admit: 2017-06-30 | Discharge: 2017-06-30 | Disposition: A | Discharge status: Home / Self Care | Payer: Medicare HMO | Source: Ambulatory Visit | Attending: Orthopaedic Surgery | Admitting: Orthopaedic Surgery

## 2017-06-30 DIAGNOSIS — S46211A Strain of muscle, fascia and tendon of other parts of biceps, right arm, initial encounter: Secondary | ICD-10-CM | POA: Diagnosis not present

## 2017-06-30 DIAGNOSIS — G8929 Other chronic pain: Secondary | ICD-10-CM

## 2017-06-30 DIAGNOSIS — M19011 Primary osteoarthritis, right shoulder: Secondary | ICD-10-CM | POA: Insufficient documentation

## 2017-06-30 DIAGNOSIS — X58XXXA Exposure to other specified factors, initial encounter: Secondary | ICD-10-CM | POA: Insufficient documentation

## 2017-06-30 DIAGNOSIS — M25511 Pain in right shoulder: Secondary | ICD-10-CM

## 2017-06-30 DIAGNOSIS — M7551 Bursitis of right shoulder: Secondary | ICD-10-CM | POA: Insufficient documentation

## 2017-07-02 ENCOUNTER — Encounter (INDEPENDENT_AMBULATORY_CARE_PROVIDER_SITE_OTHER): Payer: Self-pay | Admitting: Orthopaedic Surgery

## 2017-07-02 ENCOUNTER — Ambulatory Visit: Payer: Medicare HMO | Admitting: Neurology

## 2017-07-02 ENCOUNTER — Ambulatory Visit (INDEPENDENT_AMBULATORY_CARE_PROVIDER_SITE_OTHER): Payer: Medicare HMO | Admitting: Orthopaedic Surgery

## 2017-07-02 ENCOUNTER — Encounter: Payer: Self-pay | Admitting: Neurology

## 2017-07-02 VITALS — BP 161/50 | HR 86 | Wt 124.5 lb

## 2017-07-02 VITALS — Ht 64.0 in | Wt 125.0 lb

## 2017-07-02 DIAGNOSIS — F0281 Dementia in other diseases classified elsewhere with behavioral disturbance: Secondary | ICD-10-CM | POA: Diagnosis not present

## 2017-07-02 DIAGNOSIS — G301 Alzheimer's disease with late onset: Secondary | ICD-10-CM | POA: Diagnosis not present

## 2017-07-02 DIAGNOSIS — G8929 Other chronic pain: Secondary | ICD-10-CM | POA: Diagnosis not present

## 2017-07-02 DIAGNOSIS — M25511 Pain in right shoulder: Secondary | ICD-10-CM

## 2017-07-02 NOTE — Progress Notes (Addendum)
Reason for visit: Alzheimer's disease  Jennifer Cruz is an 83 y.o. female  History of present illness:  Jennifer Cruz is an 83 year old right-handed white female with a history of a progressive memory disturbance.  The patient is on Namzeric, she is tolerating this well.  She has been able to gain 2 pounds of weight since last seen.  The patient has had some anxiety and slight agitation that is exacerbated by her memory problem, she was placed on 5 mg of Lexapro but she did not tolerate the medication after 1 tablet.  The patient felt somewhat more confused on the medication.  The patient continues to operate a motor vehicle short distances in town only.  She does have some problems keeping up with appointments.  She lost her credit card last week and she locked herself out of the house.  The patient does take notes and keeps a calendar, but she does not check the notes regularly.  The patient returns to this office for an evaluation.   Past Medical History:  Diagnosis Date  . Alzheimer disease 04/16/2017  . Anxiety   . Depression   . Diverticulosis   . Dysrhythmia    palpitations  . Hyperlipidemia   . Hypertension   . Macular degeneration   . Memory loss    work up negative for Alzheimer's per medical records.  . Osteoarthritis   . Pulmonary nodules    per patient work-up completed at [facility] and no further evaluation needed    Past Surgical History:  Procedure Laterality Date  . BREAST ENHANCEMENT SURGERY    . CATARACT EXTRACTION  2009  . COLONOSCOPY WITH PROPOFOL N/A 09/17/2012   Dr.Rourk- normal rectum, pancolonic diverticulosis: o/w the remainder of the colonic mucosa appeared normal. it is notable the left colon was somewhat stiff and noncompliant.   . incomplete colonoscopy  2005   inadequate sedation, [facility]  . REFRACTIVE SURGERY  2010  . TONSILLECTOMY    . TOTAL KNEE ARTHROPLASTY  05/2009   right    Family History  Problem Relation Age of Onset  . Diabetes Father   .  Stroke Father   . Alzheimer's disease Mother     Social history:  reports that she quit smoking about 64 years ago. Her smoking use included cigarettes. She has a 0.60 pack-year smoking history. she has never used smokeless tobacco. She reports that she drinks about 0.6 oz of alcohol per week. She reports that she does not use drugs.    Allergies  Allergen Reactions  . Celecoxib Hives  . Amitriptyline Hcl     REACTION: Palpatations  . Prochlorperazine Edisylate     REACTION: Used for nausea and developed tardive dyskinesia    Medications:  Prior to Admission medications   Medication Sig Start Date End Date Taking? Authorizing Provider  B Complex Vitamins (B COMPLEX PO) daily. 05/28/16  Yes [provider]  CALCIUM CITRATE-VITAMIN D PO daily. 05/28/16  Yes [provider]  fish oil-omega-3 fatty acids 1000 MG capsule Take 2 g by mouth daily.   Yes [provider]  ibuprofen (ADVIL,MOTRIN) 100 MG tablet Take 100 mg by mouth every 4 (four) hours as needed for fever.   Yes [provider]  Memantine HCl-Donepezil HCl (NAMZARIC) 28-10 MG CP24 Take 1 capsule by mouth daily. 02/24/17  Yes Kathrynn Ducking, MD  Multiple Vitamins-Minerals (MULTIVITAMIN WITH MINERALS) tablet Take 1 tablet by mouth daily.   Yes [provider]  Vitamins A &  D 5000-400 units CAPS  05/28/16  Yes [provider]    ROS:  Out of a complete 14 system review of symptoms, the patient complains only of the following symptoms, and all other reviewed systems are negative.  Joint pain Bruising easily Memory disturbance  Blood pressure (!) 161/50, pulse 86, weight 124 lb 8 oz (56.5 kg), SpO2 98 %.  Physical Exam  General: The patient is alert and cooperative at the time of the examination.  Skin: No significant peripheral edema is noted.   Neurologic Exam  Mental status: The patient is alert and oriented x 3 at the time of the examination. The patient has  apparent normal recent and remote memory, with an apparently normal attention span and concentration ability.   Cranial nerves: Facial symmetry is present. Speech is normal, no aphasia or dysarthria is noted. Extraocular movements are full. Visual fields are full.  Motor: The patient has good strength in all 4 extremities.  Sensory examination: Soft touch sensation is symmetric on the face, arms, and legs.  Coordination: The patient has good finger-nose-finger and heel-to-shin bilaterally.  Gait and station: The patient has a normal gait. Tandem gait is slightly unsteady. Romberg is negative. No drift is seen.  Reflexes: Deep tendon reflexes are symmetric.   Assessment/Plan:  1.  Memory disturbance  The patient will try to re-start the Lexapro in low-dose.  If she does have problems with the medication, she will contact our office.  She will stay on the Namzeric.  She will follow-up for next scheduled visit in March 2019.  At least 25 minutes was spent with the patient and her husband answering questions and discussing therapies.  Jill Alexanders MD 07/02/2017 8:57 AM  [facility] [address] [place], [place] [address]  Phone [phone] Fax [phone]

## 2017-07-02 NOTE — Progress Notes (Signed)
Office Visit Note   Patient: Jennifer Cruz           Date of Birth: [birth year]           MRN: [id number] Visit Date: 07/02/2017              Requested by: Asencion Noble, MD [address] [place], [place] [address] PCP: Asencion Noble, MD   Assessment & Plan: Visit Diagnoses:  1. Chronic right shoulder pain     Plan: MRI scan right shoulder were performed with evidence of full-thickness tear of the supraspinatus tendon. There may also be a tear of the biceps tendon although clinically it appears to be intact. The full-thickness supraspinatus tear was retracted up to 19 mm. There are some moderate arthropathy changes at the acromioclavicular joint and moderate thinning of the humeral and glenoid cartilage. Long discussion with Mr. and Mrs. Speth regarding the above. Obviously there is a surgical option. They like to try a course of physical therapy at [facility]. We'll set this up and plan to check her back in 4-6 weeks Voltaren gel to help her sleep at night  Follow-Up Instructions: Return if symptoms worsen or fail to improve.   Orders:  No orders of the defined types were placed in this encounter.  No orders of the defined types were placed in this encounter.     Procedures: No procedures performed   Clinical Data: No additional findings.   Subjective: Chief Complaint  Patient presents with  . Right Shoulder - Pain, Results    Jennifer Cruz is an 83 y o here for MRI results of Right shoulder  Persistent pain right shoulder after a fall about 5 weeks ago. No relief with cortisone injection. MRI scan performed with results as above  HPI  Review of Systems  Constitutional: Negative for chills, fatigue and fever.  HENT: Positive for hearing loss.   Eyes: Negative for itching.  Respiratory: Negative for chest tightness and shortness of breath.   Cardiovascular: Negative for chest pain, palpitations and leg swelling.  Gastrointestinal: Negative for blood in stool,  constipation and diarrhea.  Endocrine: Negative for polyuria.  Genitourinary: Negative for dysuria.  Musculoskeletal: Positive for back pain. Negative for joint swelling, neck pain and neck stiffness.  Allergic/Immunologic: Negative for immunocompromised state.  Neurological: Negative for dizziness and numbness.  Hematological: Does not bruise/bleed easily.  Psychiatric/Behavioral: The patient is nervous/anxious.      Objective: Vital Signs: Ht 5\' 4"  (1.626 m)   Wt 125 lb (56.7 kg)   BMI 21.46 kg/m   Physical Exam  Ortho Exam awake alert and oriented 3. Able to place her right arm overhead but with a significant painful arc of motion. Significant weakness with internal rotation. Not with external rotation. Biceps appears to be intact. No pain at the acromioclavicular joint. No crepitation. Does have positive impingement and positive empty can testing. No pain with range of motion of cervical spine  Specialty Comments:  No specialty comments available.  Imaging: No results found.   PMFS History: Patient Active Problem List   Diagnosis Date Noted  . Alzheimer disease 04/16/2017  . Chronic left-sided low back pain with left-sided sciatica 11/04/2016  . Pain in left hip 11/04/2016  . Spondylolisthesis of lumbar region 11/04/2016  . Spondylosis without myelopathy or radiculopathy, lumbar region 11/04/2016  . Change in stool caliber 01/05/2015  . Encounter for screening colonoscopy 04/30/2012  . RLQ fullness 04/30/2012  . PALPITATIONS 02/23/2009  . VISUAL IMPAIRMENT 05/11/2008  .  OSTEOARTHRITIS, KNEES, BILATERAL 02/18/2008  . ELEVATED BLOOD PRESSURE WITHOUT DIAGNOSIS OF HYPERTENSION 11/17/2007  . MEMORY LOSS 08/17/2007  . MRI, BRAIN, ABNORMAL 08/17/2007  . OSTEOPENIA 07/08/2007  . ANXIETY DEPRESSION 05/08/2007  . HYPERLIPIDEMIA 04/24/2007  . DECREASED HEARING 04/24/2007  . PULMONARY NODULE 04/24/2007   Past Medical History:  Diagnosis Date  . Alzheimer disease  04/16/2017  . Anxiety   . Depression   . Diverticulosis   . Dysrhythmia    palpitations  . Hyperlipidemia   . Hypertension   . Macular degeneration   . Memory loss    work up negative for Alzheimer's per medical records.  . Osteoarthritis   . Pulmonary nodules    per patient work-up completed at [facility] and no further evaluation needed    Family History  Problem Relation Age of Onset  . Diabetes Father   . Stroke Father   . Alzheimer's disease Mother     Past Surgical History:  Procedure Laterality Date  . BREAST ENHANCEMENT SURGERY    . CATARACT EXTRACTION  2009  . COLONOSCOPY WITH PROPOFOL N/A 09/17/2012   Dr.Rourk- normal rectum, pancolonic diverticulosis: o/w the remainder of the colonic mucosa appeared normal. it is notable the left colon was somewhat stiff and noncompliant.   . incomplete colonoscopy  2005   inadequate sedation, [facility]  . REFRACTIVE SURGERY  2010  . TONSILLECTOMY    . TOTAL KNEE ARTHROPLASTY  05/2009   right   Social History   Occupational History  . Occupation: Retired    Comment: Patent attorney, Training and development officer  Tobacco Use  . Smoking status: Former Smoker    Packs/day: 0.30    Years: 2.00    Pack years: 0.60    Types: Cigarettes    Last attempt to quit: 09/14/1952    Years since quitting: 64.8  . Smokeless tobacco: Never Used  . Tobacco comment: quit in college  Substance and Sexual Activity  . Alcohol use: Yes    Alcohol/week: 0.6 oz    Types: 1 Glasses of wine per week    Comment: a glass of red wine every night  . Drug use: No  . Sexual activity: Yes    Birth control/protection: Post-menopausal

## 2017-07-14 ENCOUNTER — Ambulatory Visit (HOSPITAL_COMMUNITY): Payer: Medicare HMO | Attending: Orthopaedic Surgery

## 2017-07-14 ENCOUNTER — Encounter (HOSPITAL_COMMUNITY): Payer: Self-pay

## 2017-07-14 ENCOUNTER — Other Ambulatory Visit: Payer: Self-pay

## 2017-07-14 DIAGNOSIS — M6281 Muscle weakness (generalized): Secondary | ICD-10-CM | POA: Diagnosis not present

## 2017-07-14 DIAGNOSIS — M25511 Pain in right shoulder: Secondary | ICD-10-CM | POA: Diagnosis not present

## 2017-07-14 DIAGNOSIS — R29898 Other symptoms and signs involving the musculoskeletal system: Secondary | ICD-10-CM

## 2017-07-14 NOTE — Therapy (Signed)
[facility] [address] [place], [place], [address] Phone: [phone]   Fax:  [phone]  Physical Therapy Evaluation  Patient Details  Name: Jennifer Cruz MRN: [id number] Date of Birth: [birth year] Referring Provider: Joni Fears   Encounter Date: 07/14/2017  PT End of Session - 07/14/17 2010    Visit Number  1    Number of Visits  17    Date for PT Re-Evaluation  08/11/17    Authorization Type  [employer] medicare     Authorization Time Period  12/127/18 - 09/08/17    Authorization - Visit Number  1    Authorization - Number of Visits  10    PT Start Time  1302    PT Stop Time  1346    PT Time Calculation (min)  44 min    Activity Tolerance  Patient tolerated treatment well    Behavior During Therapy  [facility] for tasks assessed/performed       Past Medical History:  Diagnosis Date  . Alzheimer disease 04/16/2017  . Anxiety   . Depression   . Diverticulosis   . Dysrhythmia    palpitations  . Hyperlipidemia   . Hypertension   . Macular degeneration   . Memory loss    work up negative for Alzheimer's per medical records.  . Osteoarthritis   . Pulmonary nodules    per patient work-up completed at [facility] and no further evaluation needed    Past Surgical History:  Procedure Laterality Date  . BREAST ENHANCEMENT SURGERY    . CATARACT EXTRACTION  2009  . COLONOSCOPY WITH PROPOFOL N/A 09/17/2012   Dr.Rourk- normal rectum, pancolonic diverticulosis: o/w the remainder of the colonic mucosa appeared normal. it is notable the left colon was somewhat stiff and noncompliant.   . incomplete colonoscopy  2005   inadequate sedation, [facility]  . REFRACTIVE SURGERY  2010  . TONSILLECTOMY    . TOTAL KNEE ARTHROPLASTY  05/2009   right    There were no vitals filed for this visit.   Subjective Assessment - 07/14/17 1308    Subjective  Patient reports she had a fall on her wood stairs approximately 6-7 weeks ago to catch her self she  reached out to grab the hand rail, she felt a pull in both of her shoulders and has had pain and difficulty lifting her arms since. She has more pain and limitations lifting her right arm than her left. She denies all other falls or hitting her head. She states the pain does not wake her up at night and at most is a 3/10 but she has difficulty with her daily activities like doing her hair and make-up.     Patient is accompained by:  Family member husband    Pertinent History  R TKA in 2010, Left knee pain, Left foot OA.     Limitations  Lifting;House hold activities    How long can you sit comfortably?  unlimited    How long can you stand comfortably?  unlimted    How long can you walk comfortably?  unlimited    Diagnostic tests  Imaing of Left knee and Lumbar spine with noted anterolisthesis (Gr2) at L5/S1; MRI on right shoulder, indicating torn rotator cuff    Patient Stated Goals  get stronger and be pain free when raising arm overhead    Currently in Pain?  No/denies    Multiple Pain Sites  No  [facility] PT Assessment - 07/14/17 0001      Assessment   Medical Diagnosis  Right Rotator Cuff pain    Referring Provider  Ricky Stabs    Onset Date/Surgical Date  05/26/17 approximately    Hand Dominance  Right    Next MD Visit  None as of now    Prior Therapy  For R TKA several years ago, and for LBP in last year      Precautions   Precautions  None patient's spouse reports MD said "don't do things that hurt"    Precaution Comments  patient spouse reports MD suggested wearing a sling to immobilize shoulder, PT will call and clarify with MD      Restrictions   Weight Bearing Restrictions  No      Balance Screen   Has the patient fallen in the past 6 months  Yes    How many times?  1    Has the patient had a decrease in activity level because of a fear of falling?   Yes    Is the patient reluctant to leave their home because of a fear of falling?   No      Home Environment    Living Environment  Private residence    Living Arrangements  Spouse/significant other    Available Help at Discharge  Family    Type of [place] to enter    Entrance Stairs-Number of Steps  2-3 with hand rails    Home Layout  Multi-level 3 stories (english basement)    Home Equipment  Holden - single point      Prior Function   Level of Independence  Independent;Independent with basic ADLs    Vocation  Retired    Leisure  driving locally, gardening, Advertising account planner   Overall Cognitive Status  Within Functional Limits for tasks assessed    Memory  Impaired    Memory Impairment  Retrieval deficit patient diagnosed with late onset alzheimers     Awareness  Appears intact    Problem Solving  Appears intact      Sensation   Light Touch  Appears Intact      Posture/Postural Control   Posture/Postural Control  No significant limitations      ROM / Strength   AROM / PROM / Strength  Strength      AROM   Overall AROM Comments  PROM normal throughout, pain at end range on right    Right Shoulder Flexion  160 Degrees painful at 90 with AROM, patient with shoulder shrug    Right Shoulder ABduction  90 Degrees pain at 90 with AROM    Right Shoulder Internal Rotation  -- painful with AROM and resisted at 0* abduction    Right Shoulder External Rotation  60 Degrees pain resisted    Left Shoulder Flexion  170 Degrees    Left Shoulder ABduction  120 Degrees    Left Shoulder Internal Rotation  -- patient able to achieve IR/ext combined movement; painful      Strength   Right/Left Shoulder  Right;Left    Right Shoulder Flexion  3+/5    Right Shoulder ABduction  3+/5    Right Shoulder Internal Rotation  3+/5    Right Shoulder External Rotation  3+/5    Left Shoulder Flexion  4-/5    Left Shoulder ABduction  4-/5    Left Shoulder Internal  Rotation  4/5    Left Shoulder External Rotation  3+/5    Right/Left Elbow  Right;Left      Special Tests     Special Tests  Rotator Cuff Impingement    Rotator Cuff Impingment tests  Neer impingement test;Lift- off test;Drop Arm test;Painful Arc of Motion;Full Can test      Neer Impingement test    Findings  Positive    Side  Right      Lift-Off test   Findings  Positive    Side  Right    Comment  both sides positive      Full Can test   Findings  Positive    Side  Right      Drop Arm test   Findings  Positive    Side  Right      Painful Arc of Motion   Findings  Positive    Side  Right    Comments  90-160      Objective measurements completed on examination: See above findings.    PT Education - 07/14/17 1359    Education provided  Yes    Education Details  caution to not perform activities that cause pain in R or L shoulder, encouraged to wear sling when until clarified wtih MD.    Terence Lux) Educated  Patient    Methods  Explanation    Comprehension  Verbalized understanding       PT Short Term Goals - 07/14/17 2019      PT SHORT TERM GOAL #1   Title  After 2 weeks pt will demonstrat independence with HEP.     Baseline  2    Time  3    Period  Weeks    Status  New    Target Date  08/04/17      PT SHORT TERM GOAL #2   Title  patient will improve AROM by 10 degrees for all limited movements to be able to do makeup and ADL's.    Time  4    Period  Weeks    Status  New    Target Date  08/11/17      PT SHORT TERM GOAL #3   Title  patient will improve MMT for groups tested by 1/2 grade for all limited movements to be able to do makeup and ADL's.    Time  4    Period  Weeks    Status  New        PT Long Term Goals - 07/14/17 2021      PT LONG TERM GOAL #1   Title  After six weeks pt will demonstrate indep in advanced HEP for DC.     Time  8    Period  Weeks    Status  New    Target Date  09/08/17      PT LONG TERM GOAL #2   Title  Patient BUE shoulder AROM will be within functional limits to improve performance with ADL's: washing hair, blow drying hair,  applying make up.    Time  8    Period  Weeks    Status  New      PT LONG TERM GOAL #3   Title  Patient will have no painful arc with AROM of right shoulder to perform ADL's with no pain to improve QOL.    Time  8    Period  Weeks    Status  New  Plan - Aug 03, 2017 2011    Clinical Impression Statement  Patient presented today for initial physical therapy evaluation for right shoulder pain with MRI findings of right full supraspinatus tear. She presents with decreased strength, ROM, and pain in bilateral upper extremities, right greater than left. Ms. Retz will benefit from skilled PT interventions to address current limitations and progress functional strength and motion to improve QOL and decrease pain.    Clinical Presentation  Stable    Clinical Presentation due to:  right shoulder pain, limited ROM, BUE weakness,     Clinical Decision Making  Low    Rehab Potential  Good    Clinical Impairments Affecting Rehab Potential  (+) supportive family, (+) motivation to participate    PT Frequency  2x / week    PT Duration  8 weeks    PT Treatment/Interventions  ADLs/Self Care Home Management;Cryotherapy;Electrical Stimulation;Functional mobility training;Therapeutic activities;Therapeutic exercise;Neuromuscular re-education;Patient/family education;Manual techniques;Taping    PT Next Visit Plan  Review Eval and goals. Continue with further objective measures, initiate isometric strengthening and PROM, and AAROM.    PT Home Exercise Plan  initiate on first treatment session    Consulted and Agree with Plan of Care  Patient;Family member/caregiver husband    Family Member Consulted  husband       Patient will benefit from skilled therapeutic intervention in order to improve the following deficits and impairments:  Decreased mobility, Improper body mechanics, Impaired UE functional use, Pain, Decreased range of motion, Decreased activity tolerance, Decreased strength, Decreased  endurance, Impaired flexibility  Visit Diagnosis: Acute pain of right shoulder  Muscle weakness (generalized)  Other symptoms and signs involving the musculoskeletal system  G-Codes - 2017-08-03 2025    Functional Assessment Tool Used (Outpatient Only)  Clinical judgement based on assessment of strength, FOTO    Functional Limitation  Carrying, moving and handling objects    Carrying, Moving and Handling Objects Current Status (H4193)  At least 40 percent but less than 60 percent impaired, limited or restricted    Carrying, Moving and Handling Objects Goal Status (X9024)  At least 20 percent but less than 40 percent impaired, limited or restricted        Problem List Patient Active Problem List   Diagnosis Date Noted  . Alzheimer disease 04/16/2017  . Chronic left-sided low back pain with left-sided sciatica 11/04/2016  . Pain in left hip 11/04/2016  . Spondylolisthesis of lumbar region 11/04/2016  . Spondylosis without myelopathy or radiculopathy, lumbar region 11/04/2016  . Change in stool caliber 01/05/2015  . Encounter for screening colonoscopy 04/30/2012  . RLQ fullness 04/30/2012  . PALPITATIONS 02/23/2009  . VISUAL IMPAIRMENT 05/11/2008  . OSTEOARTHRITIS, KNEES, BILATERAL 02/18/2008  . ELEVATED BLOOD PRESSURE WITHOUT DIAGNOSIS OF HYPERTENSION 11/17/2007  . MEMORY LOSS 08/17/2007  . MRI, BRAIN, ABNORMAL 08/17/2007  . OSTEOPENIA 07/08/2007  . ANXIETY DEPRESSION 05/08/2007  . HYPERLIPIDEMIA 04/24/2007  . DECREASED HEARING 04/24/2007  . PULMONARY NODULE 04/24/2007    Kipp Brood, PT, DPT Physical Therapist with [facility]  03-Aug-2017 8:28 PM    [facility] [address] [place], [place], [address] Phone: [phone]   Fax:  [phone]  Name: TAUNI SANKS MRN: [id number] Date of Birth: [birth year]

## 2017-07-15 ENCOUNTER — Encounter (HOSPITAL_COMMUNITY): Payer: Self-pay

## 2017-07-15 ENCOUNTER — Ambulatory Visit (HOSPITAL_COMMUNITY): Payer: Medicare HMO

## 2017-07-15 DIAGNOSIS — M6281 Muscle weakness (generalized): Secondary | ICD-10-CM

## 2017-07-15 DIAGNOSIS — M25511 Pain in right shoulder: Secondary | ICD-10-CM

## 2017-07-15 DIAGNOSIS — R29898 Other symptoms and signs involving the musculoskeletal system: Secondary | ICD-10-CM | POA: Diagnosis not present

## 2017-07-15 NOTE — Therapy (Signed)
[facility] [address] [place], [place], [address] Phone: [phone]   Fax:  [phone]  Physical Therapy Treatment  Patient Details  Name: Jennifer Cruz MRN: [id number] Date of Birth: 12-08-1930 Referring Provider: Joni Fears   Encounter Date: 07/15/2017  PT End of Session - 07/15/17 1532    Visit Number  2    Number of Visits  17    Date for PT Re-Evaluation  08/11/17    Authorization Type  [employer]     Authorization Time Period  12/127/18 - 09/08/17    Authorization - Visit Number  2    Authorization - Number of Visits  10    PT Start Time  5638    PT Stop Time  1110    PT Time Calculation (min)  47 min    Activity Tolerance  Patient tolerated treatment well    Behavior During Therapy  [facility] for tasks assessed/performed       Past Medical History:  Diagnosis Date  . Alzheimer disease 04/16/2017  . Anxiety   . Depression   . Diverticulosis   . Dysrhythmia    palpitations  . Hyperlipidemia   . Hypertension   . Macular degeneration   . Memory loss    work up negative for Alzheimer's per medical records.  . Osteoarthritis   . Pulmonary nodules    per patient work-up completed at [facility] and no further evaluation needed    Past Surgical History:  Procedure Laterality Date  . BREAST ENHANCEMENT SURGERY    . CATARACT EXTRACTION  2009  . COLONOSCOPY WITH PROPOFOL N/A 09/17/2012   Dr.Rourk- normal rectum, pancolonic diverticulosis: o/w the remainder of the colonic mucosa appeared normal. it is notable the left colon was somewhat stiff and noncompliant.   . incomplete colonoscopy  2005   inadequate sedation, [facility]  . REFRACTIVE SURGERY  2010  . TONSILLECTOMY    . TOTAL KNEE ARTHROPLASTY  05/2009   right    There were no vitals filed for this visit.  Subjective Assessment - 07/15/17 1032    Subjective  Pt stated she has pain with movement Rt shoulder, pain scale 5/10 currently.    Pertinent History  R TKA  in 2010, Left knee pain, Left foot OA.     Patient Stated Goals  get stronger and be pain free when raising arm overhead    Currently in Pain?  Yes    Pain Score  5     Pain Location  Shoulder    Pain Orientation  Right    Pain Descriptors / Indicators  Aching;Sore    Pain Type  Acute pain    Pain Onset  More than a month ago    Pain Frequency  Intermittent    Aggravating Factors   with active movement and bed mobility    Pain Relieving Factors  heat    Effect of Pain on Daily Activities  more cautious and uses opposite UE for functional use         [facility] PT Assessment - 07/15/17 0001      Assessment   Medical Diagnosis  Right Rotator Cuff pain    Referring Provider  Joni Fears    Onset Date/Surgical Date  05/26/17 appropximately    Hand Dominance  Right    Next MD Visit  None as of now    Prior Therapy  For R TKA several years ago, and for LBP in last year  Precautions   Precautions  None    Precaution Comments  patient spouse reports MD suggested wearing a sling to immobilize shoulder, PT messaged MD and sling just for pain control not required      ROM / Strength   AROM / PROM / Strength  AROM      AROM   Overall AROM Comments  PROM normal throughout, pain at end range    Right Shoulder Internal Rotation  78 Degrees pain wiht AROM    Left Shoulder Internal Rotation  -- Lt ER 60 and IR at 78 with pain                  [facility] Adult PT Treatment/Exercise - 07/15/17 0001      Exercises   Exercises  Shoulder      Shoulder Exercises: Supine   External Rotation  PROM;5 reps;AAROM    External Rotation Limitations  PROM as tolerated; AAROM with dowel rod    Internal Rotation  PROM;AAROM;5 reps    Internal Rotation Limitations  PROM as tolerated; AAROM with dowel rod    Flexion  PROM;AAROM;5 reps    Flexion Limitations  PROM as tolerated; AAROM with dowel rod    ABduction  PROM;AAROM;5 reps    ABduction Limitations  PROM as tolerated; AAROM with dowel  rod    Other Supine Exercises  cervical and scapular retraction 10x with mod cueing for form      Shoulder Exercises: Seated   External Rotation  Right;5 reps;AAROM    External Rotation Limitations  AAROM with dowel rod    Flexion  5 reps;AAROM    Flexion Limitations  cueing for form; AAROM with dowel rod    Abduction  AAROM;5 reps    ABduction Limitations  cueing for form; AAROM with dowel rod             PT Education - 07/15/17 1533    Education provided  Yes    Education Details  Reviewed goals, established HEP based on shoulder mobilty and copy of eval given to pt.  Discussed use of sling with clarification with MD for pain control.    Person(s) Educated  Patient    Methods  Demonstration;Handout    Comprehension  Verbalized understanding;Verbal cues required       PT Short Term Goals - 07/14/17 2019      PT SHORT TERM GOAL #1   Title  After 2 weeks pt will demonstrat independence with HEP.     Baseline  2    Time  3    Period  Weeks    Status  New    Target Date  08/04/17      PT SHORT TERM GOAL #2   Title  patient will improve AROM by 10 degrees for all limited movements to be able to do makeup and ADL's.    Time  4    Period  Weeks    Status  New    Target Date  08/11/17      PT SHORT TERM GOAL #3   Title  patient will improve MMT for groups tested by 1/2 grade for all limited movements to be able to do makeup and ADL's.    Time  4    Period  Weeks    Status  New        PT Long Term Goals - 07/14/17 2021      PT LONG TERM GOAL #1   Title  After six  weeks pt will demonstrate indep in advanced HEP for DC.     Time  8    Period  Weeks    Status  New    Target Date  09/08/17      PT LONG TERM GOAL #2   Title  Patient BUE shoulder AROM will be within functional limits to improve performance with ADL's: washing hair, blow drying hair, applying make up.    Time  8    Period  Weeks    Status  New      PT LONG TERM GOAL #3   Title  Patient will have  no painful arc with AROM of right shoulder to perform ADL's with no pain to improve QOL.    Time  8    Period  Weeks    Status  New            Plan - 07/15/17 1535    Clinical Impression Statement  Reviewed goals, established HEP with AAROM for shoulder mobility and copy of eval given to pt.  Session focus on PROM for shoulder mobility with some pain returning to neutral position (~90 degrees).  Pt educated and established HEP for shoulder mobility, able to verbalize and demonstrate appropriate form with exercises. EOS pt reports pain reduced to 3/10.      Rehab Potential  Good    Clinical Impairments Affecting Rehab Potential  (+) supportive family, (+) motivation to participate    PT Frequency  2x / week    PT Duration  8 weeks    PT Treatment/Interventions  ADLs/Self Care Home Management;Cryotherapy;Electrical Stimulation;Functional mobility training;Therapeutic activities;Therapeutic exercise;Neuromuscular re-education;Patient/family education;Manual techniques;Taping    PT Next Visit Plan  Next session initiate isometric strengthening.  Continue PROM and AAROM.      PT Home Exercise Plan  AAROM with cane for flexion, abduction and ER.         Patient will benefit from skilled therapeutic intervention in order to improve the following deficits and impairments:  Decreased mobility, Improper body mechanics, Impaired UE functional use, Pain, Decreased range of motion, Decreased activity tolerance, Decreased strength, Decreased endurance, Impaired flexibility  Visit Diagnosis: Acute pain of right shoulder  Muscle weakness (generalized)  Other symptoms and signs involving the musculoskeletal system   Problem List Patient Active Problem List   Diagnosis Date Noted  . Alzheimer disease 04/16/2017  . Chronic left-sided low back pain with left-sided sciatica 11/04/2016  . Pain in left hip 11/04/2016  . Spondylolisthesis of lumbar region 11/04/2016  . Spondylosis without myelopathy  or radiculopathy, lumbar region 11/04/2016  . Change in stool caliber 01/05/2015  . Encounter for screening colonoscopy 04/30/2012  . RLQ fullness 04/30/2012  . PALPITATIONS 02/23/2009  . VISUAL IMPAIRMENT 05/11/2008  . OSTEOARTHRITIS, KNEES, BILATERAL 02/18/2008  . ELEVATED BLOOD PRESSURE WITHOUT DIAGNOSIS OF HYPERTENSION 11/17/2007  . MEMORY LOSS 08/17/2007  . MRI, BRAIN, ABNORMAL 08/17/2007  . OSTEOPENIA 07/08/2007  . ANXIETY DEPRESSION 05/08/2007  . HYPERLIPIDEMIA 04/24/2007  . DECREASED HEARING 04/24/2007  . PULMONARY NODULE 04/24/2007   Ihor Austin, [place]; [place]ilo  Aldona Lento 07/15/2017, 3:43 PM  [facility] [address] [place], [place], [address] Phone: [phone]   Fax:  [phone]  Name: Jennifer Cruz MRN: [id number] Date of Birth: [birth year]

## 2017-07-15 NOTE — Patient Instructions (Signed)
Shoulder: Flexion (Supine)    With hands shoulder width apart and elbows straight, slowly lower cane to floor behind head. Do not let elbows bend. Keep back flat. Hold 3 seconds. Repeat 5-10 times. Do 1-2 sessions per day. CAUTION: Stretch slowly and gently.  Copyright  VHI. All rights reserved.   Shoulder: Abduction (Supine)    Sitting up nice and tall.  Use cane in both hands and slowly raise arm beside head with left arm assisting Hold 3-5 seconds. Repeat 5-10 times. Do 1-2 sessions per day. CAUTION: Stretch slowly and gently.  Copyright  VHI. All rights reserved.   SHOULDER: External Rotation - Supine (Cane)    Sitting tall or laying on back.  Hold cane with both hands. Rotate arm away from body. Keep elbow next to body.5-10  reps per set, 1-2 sets per day Add towel to keep elbow at side.  Copyright  VHI. All rights reserved.

## 2017-07-24 ENCOUNTER — Ambulatory Visit (HOSPITAL_COMMUNITY): Payer: Medicare HMO

## 2017-07-24 ENCOUNTER — Encounter (HOSPITAL_COMMUNITY): Payer: Self-pay

## 2017-07-24 DIAGNOSIS — M6281 Muscle weakness (generalized): Secondary | ICD-10-CM | POA: Diagnosis not present

## 2017-07-24 DIAGNOSIS — R29898 Other symptoms and signs involving the musculoskeletal system: Secondary | ICD-10-CM

## 2017-07-24 DIAGNOSIS — M25511 Pain in right shoulder: Secondary | ICD-10-CM | POA: Diagnosis not present

## 2017-07-24 NOTE — Therapy (Signed)
[facility] [address] [place], [place], [address] Phone: [phone]   Fax:  [phone]  Physical Therapy Treatment  Patient Details  Name: Jennifer Cruz MRN: [id number] Date of Birth: [birth year] Referring Provider: Joni Fears   Encounter Date: 07/24/2017  PT End of Session - 07/24/17 1318    Visit Number  3    Number of Visits  17    Date for PT Re-Evaluation  08/11/17    Authorization Type  [employer] medicare     Authorization Time Period  12/127/18 - 09/08/17    Authorization - Visit Number  3    Authorization - Number of Visits  10    PT Start Time  6948 Pt late for apt    PT Stop Time  1342    PT Time Calculation (min)  30 min    Activity Tolerance  Patient tolerated treatment well    Behavior During Therapy  [facility] for tasks assessed/performed       Past Medical History:  Diagnosis Date  . Alzheimer disease 04/16/2017  . Anxiety   . Depression   . Diverticulosis   . Dysrhythmia    palpitations  . Hyperlipidemia   . Hypertension   . Macular degeneration   . Memory loss    work up negative for Alzheimer's per medical records.  . Osteoarthritis   . Pulmonary nodules    per patient work-up completed at [facility] and no further evaluation needed    Past Surgical History:  Procedure Laterality Date  . BREAST ENHANCEMENT SURGERY    . CATARACT EXTRACTION  2009  . COLONOSCOPY WITH PROPOFOL N/A 09/17/2012   Dr.Rourk- normal rectum, pancolonic diverticulosis: o/w the remainder of the colonic mucosa appeared normal. it is notable the left colon was somewhat stiff and noncompliant.   . incomplete colonoscopy  2005   inadequate sedation, [facility]  . REFRACTIVE SURGERY  2010  . TONSILLECTOMY    . TOTAL KNEE ARTHROPLASTY  05/2009   right    There were no vitals filed for this visit.  Subjective Assessment - 07/24/17 1316    Subjective  Pt stated she has ache both shoulders today, pain scale 6-7/10 currently.    Patient  Stated Goals  get stronger and be pain free when raising arm overhead    Currently in Pain?  Yes    Pain Score  7     Pain Location  Shoulder    Pain Orientation  Right;Left    Pain Descriptors / Indicators  Aching;Sore    Pain Type  Acute pain    Pain Onset  More than a month ago    Pain Frequency  Intermittent    Aggravating Factors   with active movement and bed mobilty    Pain Relieving Factors  heat    Effect of Pain on Daily Activities  more cautious and uses opposite UE for functional use                      [facility] Adult PT Treatment/Exercise - 07/24/17 0001      Shoulder Exercises: Supine   External Rotation  AAROM;10 reps    Flexion  AAROM;10 reps    ABduction  AAROM;10 reps limited by pain    Other Supine Exercises  cervical and scapular retraction 10x with mod cueing for form      Shoulder Exercises: Seated   External Rotation  AROM;Right;5 reps isometric 3" holds  Internal Rotation  AROM;Right;5 reps isometric 3" holds    Flexion  AROM;Right isometric 3" holds pain free range    Abduction  AROM;5 reps isometric 3" holds    Other Seated Exercises  posterior shoulder rolls with 3" scapular retraction    Other Seated Exercises  cervical and scapular retraction 10x 5"             PT Education - 07/24/17 1318    Education provided  Yes    Education Details  Pt arrived with list of questions, answered questions concerning healing process and sling    Person(s) Educated  Patient    Methods  Explanation;Verbal cues    Comprehension  Verbalized understanding;Returned demonstration;Need further instruction       PT Short Term Goals - 07/14/17 2019      PT SHORT TERM GOAL #1   Title  After 2 weeks pt will demonstrat independence with HEP.     Baseline  2    Time  3    Period  Weeks    Status  New    Target Date  08/04/17      PT SHORT TERM GOAL #2   Title  patient will improve AROM by 10 degrees for all limited movements to be able to do  makeup and ADL's.    Time  4    Period  Weeks    Status  New    Target Date  08/11/17      PT SHORT TERM GOAL #3   Title  patient will improve MMT for groups tested by 1/2 grade for all limited movements to be able to do makeup and ADL's.    Time  4    Period  Weeks    Status  New        PT Long Term Goals - 07/14/17 2021      PT LONG TERM GOAL #1   Title  After six weeks pt will demonstrate indep in advanced HEP for DC.     Time  8    Period  Weeks    Status  New    Target Date  09/08/17      PT LONG TERM GOAL #2   Title  Patient BUE shoulder AROM will be within functional limits to improve performance with ADL's: washing hair, blow drying hair, applying make up.    Time  8    Period  Weeks    Status  New      PT LONG TERM GOAL #3   Title  Patient will have no painful arc with AROM of right shoulder to perform ADL's with no pain to improve QOL.    Time  8    Period  Weeks    Status  New            Plan - 07/24/17 1346    Clinical Impression Statement  Session focus with shoulder mobility and strengthening.  Added isometric strengthening to POC wihtin pain free range.  Pt educated on importance of posture to assist with pain control.  Pt tolerated well towards session wiht AROM WNL, was limited by pain with abduction movements.  EOS no reoprts of pain.      Rehab Potential  Good    Clinical Impairments Affecting Rehab Potential  (+) supportive family, (+) motivation to participate    PT Frequency  2x / week    PT Duration  8 weeks    PT Treatment/Interventions  ADLs/Self Care  Home Management;Cryotherapy;Electrical Stimulation;Functional mobility training;Therapeutic activities;Therapeutic exercise;Neuromuscular re-education;Patient/family education;Manual techniques;Taping    PT Next Visit Plan  Continue AAROM and  isometric/postural strengthening.      PT Home Exercise Plan  AAROM with cane for flexion, abduction and ER.         Patient will benefit from  skilled therapeutic intervention in order to improve the following deficits and impairments:  Decreased mobility, Improper body mechanics, Impaired UE functional use, Pain, Decreased range of motion, Decreased activity tolerance, Decreased strength, Decreased endurance, Impaired flexibility  Visit Diagnosis: Acute pain of right shoulder  Muscle weakness (generalized)  Other symptoms and signs involving the musculoskeletal system     Problem List Patient Active Problem List   Diagnosis Date Noted  . Alzheimer disease 04/16/2017  . Chronic left-sided low back pain with left-sided sciatica 11/04/2016  . Pain in left hip 11/04/2016  . Spondylolisthesis of lumbar region 11/04/2016  . Spondylosis without myelopathy or radiculopathy, lumbar region 11/04/2016  . Change in stool caliber 01/05/2015  . Encounter for screening colonoscopy 04/30/2012  . RLQ fullness 04/30/2012  . PALPITATIONS 02/23/2009  . VISUAL IMPAIRMENT 05/11/2008  . OSTEOARTHRITIS, KNEES, BILATERAL 02/18/2008  . ELEVATED BLOOD PRESSURE WITHOUT DIAGNOSIS OF HYPERTENSION 11/17/2007  . MEMORY LOSS 08/17/2007  . MRI, BRAIN, ABNORMAL 08/17/2007  . OSTEOPENIA 07/08/2007  . ANXIETY DEPRESSION 05/08/2007  . HYPERLIPIDEMIA 04/24/2007  . DECREASED HEARING 04/24/2007  . PULMONARY NODULE 04/24/2007   Ihor Austin, [place]; [place]  Aldona Lento 07/24/2017, 1:53 PM  [facility] [address] [place], [place], [address] Phone: [phone]   Fax:  [phone]  Name: VANCE BELCOURT MRN: [id number] Date of Birth: 08-Jan-1931

## 2017-07-24 NOTE — Patient Instructions (Signed)
Stretch Break - Shoulder Roll    Roll shoulders up, back, and down to complete a circle 10 times. Reverse direction and repeat. Repeat twice a day.    Copyright  VHI. All rights reserved.

## 2017-07-31 ENCOUNTER — Telehealth (HOSPITAL_COMMUNITY): Payer: Self-pay

## 2017-07-31 ENCOUNTER — Ambulatory Visit (HOSPITAL_COMMUNITY): Payer: Medicare HMO | Attending: Orthopaedic Surgery

## 2017-07-31 DIAGNOSIS — M25511 Pain in right shoulder: Secondary | ICD-10-CM | POA: Insufficient documentation

## 2017-07-31 DIAGNOSIS — M6281 Muscle weakness (generalized): Secondary | ICD-10-CM | POA: Insufficient documentation

## 2017-07-31 DIAGNOSIS — R29898 Other symptoms and signs involving the musculoskeletal system: Secondary | ICD-10-CM | POA: Insufficient documentation

## 2017-07-31 NOTE — Telephone Encounter (Signed)
No show, called and spoke to pt who stated she forgot about apt today.  Pt has been under the weather due to flu but is feeling better today.  Reminded next apt date and time with contact info given.  [address] [place]; [place] [phone]

## 2017-08-04 ENCOUNTER — Ambulatory Visit (HOSPITAL_COMMUNITY): Payer: Medicare HMO

## 2017-08-04 ENCOUNTER — Telehealth (HOSPITAL_COMMUNITY): Payer: Self-pay | Admitting: Internal Medicine

## 2017-08-04 NOTE — Telephone Encounter (Signed)
08/04/17  pt cx said that she has the flu

## 2017-08-05 DIAGNOSIS — J019 Acute sinusitis, unspecified: Secondary | ICD-10-CM | POA: Diagnosis not present

## 2017-08-05 DIAGNOSIS — Z6821 Body mass index (BMI) 21.0-21.9, adult: Secondary | ICD-10-CM | POA: Diagnosis not present

## 2017-08-06 ENCOUNTER — Encounter (HOSPITAL_COMMUNITY): Payer: Self-pay

## 2017-08-06 ENCOUNTER — Other Ambulatory Visit: Payer: Self-pay

## 2017-08-06 ENCOUNTER — Telehealth (HOSPITAL_COMMUNITY): Payer: Self-pay

## 2017-08-06 ENCOUNTER — Ambulatory Visit (HOSPITAL_COMMUNITY): Payer: Medicare HMO

## 2017-08-06 DIAGNOSIS — R29898 Other symptoms and signs involving the musculoskeletal system: Secondary | ICD-10-CM

## 2017-08-06 DIAGNOSIS — M6281 Muscle weakness (generalized): Secondary | ICD-10-CM

## 2017-08-06 DIAGNOSIS — M25511 Pain in right shoulder: Secondary | ICD-10-CM

## 2017-08-06 NOTE — Therapy (Signed)
[facility] [address] [place], [place], [address] Phone: [phone]   Fax:  [phone]  Physical Therapy Treatment  Patient Details  Name: Jennifer Cruz MRN: [id number] Date of Birth: [birth year] Referring Provider: Joni Fears   Encounter Date: 08/06/2017  PT End of Session - 08/06/17 1204    Visit Number  4    Number of Visits  17    Date for PT Re-Evaluation  08/11/17    Authorization Type  [employer]     Authorization Time Period  12/127/18 - 09/08/17    Authorization - Visit Number  4    Authorization - Number of Visits  10    PT Start Time  5093    PT Stop Time  1200    PT Time Calculation (min)  44 min    Activity Tolerance  Patient tolerated treatment well;No increased pain    Behavior During Therapy  WFL for tasks assessed/performed       Past Medical History:  Diagnosis Date  . Alzheimer disease 04/16/2017  . Anxiety   . Depression   . Diverticulosis   . Dysrhythmia    palpitations  . Hyperlipidemia   . Hypertension   . Macular degeneration   . Memory loss    work up negative for Alzheimer's per medical records.  . Osteoarthritis   . Pulmonary nodules    per patient work-up completed at [facility] and no further evaluation needed    Past Surgical History:  Procedure Laterality Date  . BREAST ENHANCEMENT SURGERY    . CATARACT EXTRACTION  2009  . COLONOSCOPY WITH PROPOFOL N/A 09/17/2012   Dr.Rourk- normal rectum, pancolonic diverticulosis: o/w the remainder of the colonic mucosa appeared normal. it is notable the left colon was somewhat stiff and noncompliant.   . incomplete colonoscopy  2005   inadequate sedation, [facility]  . REFRACTIVE SURGERY  2010  . TONSILLECTOMY    . TOTAL KNEE ARTHROPLASTY  05/2009   right    There were no vitals filed for this visit.  Subjective Assessment - 08/06/17 1118    Subjective  Patient is feeling good today and states she has roughly 6/10 pain when moving her right  shoulder above her head but none at rest. She feels she's improved and only needs assitance to don/doff coat now. She states she had a cold and didn't do her exercises but has been able to keep up since then. She states her husband will want to be there for her re-assessment next session.    Pertinent History  R TKA in 2010, Left knee pain, Left foot OA.     Patient Stated Goals  get stronger and be pain free when raising arm overhead    Currently in Pain?  Yes left shoulder has no pani    Pain Score  6     Pain Location  Shoulder    Pain Orientation  Right    Pain Descriptors / Indicators  Aching;Sore    Pain Type  Chronic pain    Pain Onset  More than a month ago    Pain Frequency  Intermittent when using it    Aggravating Factors   reaching up overhead    Pain Relieving Factors  eases up on its own    Effect of Pain on Daily Activities  doesnt wake me up at night anymore    Multiple Pain Sites  No       [facility] Adult PT Treatment/Exercise -  08/06/17 0001      Shoulder Exercises: Supine   External Rotation  AAROM;15 reps    External Rotation Limitations  AAROM with dowel rod, 15 reps    Flexion  PROM;15 reps    ABduction  AAROM;Right;15 reps    ABduction Limitations  AAROM with dowel rod, 15 reps    Other Supine Exercises  serratus punch up, 15 repetitions with 5 second holds      Shoulder Exercises: Sidelying   External Rotation  AROM;Strengthening;Right;15 reps;Limitations    External Rotation Limitations  2 sets, towel under elbo for positioning    Other Sidelying Exercises  rhythmic isometrics with short arc, shuolder flexed to ~90-100*, 3x 30 seconds RUE      Shoulder Exercises: Pulleys   Flexion  2 minutes;Limitations    Flexion Limitations  AAROM for RLE      Shoulder Exercises: Isometric Strengthening   Flexion  Limitations;5X5"    Flexion Limitations  2 sets, standing against wall    Extension  5X5";Limitations    Extension Limitations  2 sets, standing against wall     External Rotation  5X5";Limitations    External Rotation Limitations  2 sets, standing against wall    Internal Rotation  5X5";Limitations    Internal Rotation Limitations  2 sets, standing against wall    ABduction  5X5";Limitations    ABduction Limitations  2 sets, standing against wall      Manual Therapy   Manual Therapy  Joint mobilization    Manual therapy comments  complete seperate of other services    Joint Mobilization  inferior glide grade III to right glenohumeral joint, 3x 45 seconds at ~ 90* abduction ; PROM into flexion/abduction between mobilization for 30 seconds each      PT Education - 08/06/17 1203    Education provided  Yes    Education Details  Educated on exercise form/technique and healing time for RC tear. Added new exercises to HEP and instructed to take a break from them if it feels too soore.     Person(s) Educated  Patient    Methods  Explanation;Handout    Comprehension  Verbalized understanding;Returned demonstration;Need further instruction       PT Short Term Goals - 07/14/17 2019      PT SHORT TERM GOAL #1   Title  After 2 weeks pt will demonstrat independence with HEP.     Baseline  2    Time  3    Period  Weeks    Status  New    Target Date  08/04/17      PT SHORT TERM GOAL #2   Title  patient will improve AROM by 10 degrees for all limited movements to be able to do makeup and ADL's.    Time  4    Period  Weeks    Status  New    Target Date  08/11/17      PT SHORT TERM GOAL #3   Title  patient will improve MMT for groups tested by 1/2 grade for all limited movements to be able to do makeup and ADL's.    Time  4    Period  Weeks    Status  New        PT Long Term Goals - 07/14/17 2021      PT LONG TERM GOAL #1   Title  After six weeks pt will demonstrate indep in advanced HEP for DC.     Time  8    Period  Weeks    Status  New    Target Date  09/08/17      PT LONG TERM GOAL #2   Title  Patient BUE shoulder AROM will be  within functional limits to improve performance with ADL's: washing hair, blow drying hair, applying make up.    Time  8    Period  Weeks    Status  New      PT LONG TERM GOAL #3   Title  Patient will have no painful arc with AROM of right shoulder to perform ADL's with no pain to improve QOL.    Time  8    Period  Weeks    Status  New       Plan - 08/06/17 1205    Clinical Impression Statement  Patient is making good progress with physical therapy and reports she only needs help putting on her coat now from her husband. She reports he pain has gotten better and she feels she's made improvements. Patient was able to advance exercises today perform isometrics and AROM strengthening without an increase in right shoulder pain. She continues to be limited by right shoulder pain with flexion at ~60-120 degrees and will benefit from scapular stabilizer strengthening. She will benefit from continued skilled PT services to address current impairments and progress towards goals to improve functional mobility and QOL.    Rehab Potential  Good    Clinical Impairments Affecting Rehab Potential  (+) supportive family, (+) motivation to participate    PT Frequency  2x / week    PT Duration  8 weeks    PT Treatment/Interventions  ADLs/Self Care Home Management;Cryotherapy;Electrical Stimulation;Functional mobility training;Therapeutic activities;Therapeutic exercise;Neuromuscular re-education;Patient/family education;Manual techniques;Taping    PT Next Visit Plan  Re-Assess next session. Continue AAROM and shoudler isometrics. Begin sidelying shuolder flexion with scapular assist to prevent impingement. Continue with serratus punches and when pain free introduce anterior deltoid strengthening.     PT Home Exercise Plan  AAROM with cane for flexion, abduction and ER.  08/06/17 - shoulder isometrics: IR/flex/ext/abd, sidelying ER AROM;     Consulted and Agree with Plan of Care  Patient       Patient will  benefit from skilled therapeutic intervention in order to improve the following deficits and impairments:  Decreased mobility, Improper body mechanics, Impaired UE functional use, Pain, Decreased range of motion, Decreased activity tolerance, Decreased strength, Decreased endurance, Impaired flexibility  Visit Diagnosis: Acute pain of right shoulder  Muscle weakness (generalized)  Other symptoms and signs involving the musculoskeletal system     Problem List Patient Active Problem List   Diagnosis Date Noted  . Alzheimer disease 04/16/2017  . Chronic left-sided low back pain with left-sided sciatica 11/04/2016  . Pain in left hip 11/04/2016  . Spondylolisthesis of lumbar region 11/04/2016  . Spondylosis without myelopathy or radiculopathy, lumbar region 11/04/2016  . Change in stool caliber 01/05/2015  . Encounter for screening colonoscopy 04/30/2012  . RLQ fullness 04/30/2012  . PALPITATIONS 02/23/2009  . VISUAL IMPAIRMENT 05/11/2008  . OSTEOARTHRITIS, KNEES, BILATERAL 02/18/2008  . ELEVATED BLOOD PRESSURE WITHOUT DIAGNOSIS OF HYPERTENSION 11/17/2007  . MEMORY LOSS 08/17/2007  . MRI, BRAIN, ABNORMAL 08/17/2007  . OSTEOPENIA 07/08/2007  . ANXIETY DEPRESSION 05/08/2007  . HYPERLIPIDEMIA 04/24/2007  . DECREASED HEARING 04/24/2007  . PULMONARY NODULE 04/24/2007    Kipp Brood, PT, DPT Physical Therapist with [facility]  08/06/2017 12:18 PM    [facility]  [place], [place], [address] Phone: [phone]   Fax:  [phone]  Name: LILLYANNE BRADBURN MRN: [id number] Date of Birth: 1930/11/03

## 2017-08-06 NOTE — Telephone Encounter (Signed)
I called and spoke with the patient directly to offer an earlier appointment to her. She stated she is too far away and will come in at her time of 11:15. I thanked her and told her we will see her then.  Kipp Brood, PT, DPT Physical Therapist with [facility]  08/06/2017 10:12 AM

## 2017-08-06 NOTE — Patient Instructions (Signed)
   SIDELYING EXTERNAL ROTATION WITH TOWEL - ER: 1-2 sets of 10-15 repetitions  Lie on your side with your elbow bent to 90 degrees. Place a rolled up towel between your arm and the side your body as shown.   Squeeze your shoulder blade back and down toward your buttocks and hold that position.   Next, roll your arm upwards from your stomach area towards the ceiling while maintaining your arm against the towel and with your shoulder blade held down and back the entire time. Lower your arm and repeat.        Shoulder isometric-flexion: 1-2 sets of 10 repetitions with 5 second holds  Hold your arm against your side, with your elbow at a 90 degree angle. Without moving your body, push your fist straight into the wall ahead of you. You should feel your shoulder muscles contract. Repeat contract and relax motion.       Shoulder isometric-extension: 1-2 sets of 10 repetitions with 5 second holds  Hold your arm against your side, with your elbow at a 90 degree angle. Without moving your body, push your arm back into the wall. You should feel your shoulder muscles contract. Repeat contract and relax motion.        Shoulder isometric-internal rotation: 1-2 sets of 10 repetitions with 5 second holds  Hold your arm against your side, with your elbow at a 90 degree angle. Without actually moving your arm, push your hand into the wall. You should feel your shoulder muscles contract. Repeat contract and relax motion.         Shoulder isometric-abduction: 1-2 sets of 10 repetitions with 5 second holds  Hold your arm against your side, with your elbow at a 90 degree angle. Without moving your body, push your arm into the wall. You should feel your shoulder muscles contract. Repeat contract and relax motion.

## 2017-08-11 ENCOUNTER — Ambulatory Visit (HOSPITAL_COMMUNITY): Payer: Medicare HMO

## 2017-08-11 ENCOUNTER — Other Ambulatory Visit: Payer: Self-pay

## 2017-08-11 ENCOUNTER — Telehealth (HOSPITAL_COMMUNITY): Payer: Self-pay

## 2017-08-11 ENCOUNTER — Encounter (HOSPITAL_COMMUNITY): Payer: Self-pay

## 2017-08-11 DIAGNOSIS — M6281 Muscle weakness (generalized): Secondary | ICD-10-CM | POA: Diagnosis not present

## 2017-08-11 DIAGNOSIS — R29898 Other symptoms and signs involving the musculoskeletal system: Secondary | ICD-10-CM | POA: Diagnosis not present

## 2017-08-11 DIAGNOSIS — M25511 Pain in right shoulder: Secondary | ICD-10-CM | POA: Diagnosis not present

## 2017-08-11 NOTE — Telephone Encounter (Signed)
Pt questioned $10.00 copay-b/c last time it was 40.00. Confrimed with ins co patient's copay is 10.00 for PT faclitiy based service Outpt. Spoke to [employer] Ref# [id number] NF 08/11/17

## 2017-08-11 NOTE — Therapy (Signed)
[facility] [address] [place], [place], [address] Phone: [phone]   Fax:  [phone]  Physical Therapy Treatment/Re-Assessment  Patient Details  Name: Jennifer Cruz MRN: [id number] Date of Birth: 09-08-1930 Referring Provider: Joni Fears   Encounter Date: 08/11/2017    Past Medical History:  Diagnosis Date  . Alzheimer disease 04/16/2017  . Anxiety   . Depression   . Diverticulosis   . Dysrhythmia    palpitations  . Hyperlipidemia   . Hypertension   . Macular degeneration   . Memory loss    work up negative for Alzheimer's per medical records.  . Osteoarthritis   . Pulmonary nodules    per patient work-up completed at [facility] and no further evaluation needed    Past Surgical History:  Procedure Laterality Date  . BREAST ENHANCEMENT SURGERY    . CATARACT EXTRACTION  2009  . COLONOSCOPY WITH PROPOFOL N/A 09/17/2012   Dr.Rourk- normal rectum, pancolonic diverticulosis: o/w the remainder of the colonic mucosa appeared normal. it is notable the left colon was somewhat stiff and noncompliant.   . incomplete colonoscopy  2005   inadequate sedation, [facility]  . REFRACTIVE SURGERY  2010  . TONSILLECTOMY    . TOTAL KNEE ARTHROPLASTY  05/2009   right    There were no vitals filed for this visit.  Subjective Assessment - 08/11/17 1123    Subjective  Patient stated she is feelign well today and when asked reports she feels she has made about a 10% improvemetn overall. SHe staets it is slightly easier to do her hair and makeup however it still hurts.    Patient is accompained by:  -- husband    Pertinent History  R TKA in 2010, Left knee pain, Left foot OA.     How long can you sit comfortably?  unlimited    How long can you stand comfortably?  unlimted    How long can you walk comfortably?  unlimited    Diagnostic tests  Imaing of Left knee and Lumbar spine with noted anterolisthesis (Gr2) at L5/S1; MRI on right shoulder,  indicating torn rotator cuff    Patient Stated Goals  get stronger and be pain free when raising arm overhead    Currently in Pain?  -- pain with movement    Pain Score  0-No pain         [facility] PT Assessment - 08/11/17 0001      Assessment   Medical Diagnosis  Right Rotator Cuff pain    Referring Provider  Joni Fears    Onset Date/Surgical Date  05/26/17 approximate    Hand Dominance  Right    Next MD Visit  None as of now    Prior Therapy  For R TKA several years ago, and for LBP in last year      Precautions   Precautions  None      Restrictions   Weight Bearing Restrictions  No      Observation/Other Assessments   Focus on Therapeutic Outcomes (FOTO)   44% limited  51% on 07/14/17      AROM   Right Shoulder Flexion  170 Degrees 160* on 07/14/17    Right Shoulder ABduction  90 Degrees 90* on 07/14/17    Right Shoulder Internal Rotation  85 Degrees unable to move from 0 due to pain on 07/14/17    Right Shoulder External Rotation  85 Degrees unable to move from 0 due to pain on  07/14/17    Left Shoulder Flexion  165 Degrees 170* on 07/14/17    Left Shoulder ABduction  90 Degrees 120* on 07/14/17    Left Shoulder Internal Rotation  90 Degrees WFL on 07/14/17      Strength   Right Shoulder Flexion  3+/5    Right Shoulder ABduction  3+/5    Right Shoulder Internal Rotation  3+/5    Right Shoulder External Rotation  3+/5    Left Shoulder Flexion  3+/5    Left Shoulder ABduction  3+/5    Left Shoulder Internal Rotation  3+/5    Left Shoulder External Rotation  3+/5      Neer Impingement test    Findings  Positive      Lift-Off test   Findings  Positive    Comment  both sides positive      Painful Arc of Motion   Findings  Positive    Comments  both sides, 60-120 degrees       [facility] Adult PT Treatment/Exercise - 08/11/17 0001      Shoulder Exercises: Supine   Other Supine Exercises  serratus punch up, 15 repetitions with 5 second holds        PT  Education - 08/11/17 1816    Education provided  Yes    Education Details  Extensive time spent educated patient and spouse on progress made with RUE ROM and current limitations with poor scapular stability during shoulder elevation. Discussed slow healing progress to tissues based on age and size of tear.    Person(s) Educated  Patient;Spouse    Methods  Explanation;Handout    Comprehension  Verbalized understanding;Need further instruction;Returned demonstration       PT Short Term Goals - 08/11/17 1819      PT SHORT TERM GOAL #1   Title  After 2 weeks pt will demonstrat independence with HEP.     Time  3    Period  Weeks    Status  Achieved      PT SHORT TERM GOAL #2   Title  patient will improve AROM by 10 degrees for all limited movements to be able to do makeup and ADL's.    Baseline  08/11/17 - improvements in RUE, no improvements with LUE    Time  4    Period  Weeks    Status  Achieved      PT SHORT TERM GOAL #3   Title  patient will improve MMT for groups tested by 1/2 grade for all limited movements to be able to do makeup and ADL's.    Baseline  08/11/17 - no change    Time  4    Period  Weeks    Status  On-going        PT Long Term Goals - 08/11/17 1820      PT LONG TERM GOAL #1   Title  After six weeks pt will demonstrate indep in advanced HEP for DC.     Time  8    Period  Weeks    Status  On-going      PT LONG TERM GOAL #2   Title  Patient BUE shoulder AROM will be within functional limits to improve performance with ADL's: washing hair, blow drying hair, applying make up.    Baseline  08/11/17 - patient reports improved ability to perform tasks, remains painful    Time  8    Period  Weeks    Status  On-going      PT LONG TERM GOAL #3   Title  Patient will have no painful arc with AROM of right shoulder to perform ADL's with no pain to improve QOL.    Baseline  08/11/17 - painful arc BUE    Time  8    Period  Weeks    Status  On-going        Plan  - 08/11/17 1824    Clinical Impression Statement  Re-assessment performed today. Patient has been progressing gradually in therapy and has met 2/3 short term goals and is progressing towards her 3 long term goals. She remains limited by painful arc in BUE and continues to have positive testing for impingement and rotator cuff injury. She has full PROM and limited AROM by pain bilaterally with shoulder movement. She has demonstrated improvements with ROM and FOTO functional measures and will continue to benefit from conservative skilled PT services to gradually advance exercises, improve scapular stability, and progress towards remaining goals.    Rehab Potential  Good    Clinical Impairments Affecting Rehab Potential  (+) supportive family, (+) motivation to participate    PT Frequency  2x / week    PT Duration  8 weeks    PT Treatment/Interventions  ADLs/Self Care Home Management;Cryotherapy;Electrical Stimulation;Functional mobility training;Therapeutic activities;Therapeutic exercise;Neuromuscular re-education;Patient/family education;Manual techniques;Taping    PT Next Visit Plan  Initiate sidelying AROM with scapular assistance. Perform scapular stabilizer strengthening. Continue AAROM and shuodler isometrics. Begin sidelying shuolder flexion with scapular assist to prevent impingement. Continue with serratus punches and when pain free introduce anterior deltoid strengthening.     PT Home Exercise Plan  AAROM with cane for flexion, abduction and ER.  08/06/17 - shoulder isometrics: IR/flex/ext/abd, sidelying ER AROM;     Consulted and Agree with Plan of Care  Patient;Family member/caregiver    Family Member Consulted  husband       Patient will benefit from skilled therapeutic intervention in order to improve the following deficits and impairments:  Decreased mobility, Improper body mechanics, Impaired UE functional use, Pain, Decreased range of motion, Decreased activity tolerance, Decreased  strength, Decreased endurance, Impaired flexibility  Visit Diagnosis: Acute pain of right shoulder  Muscle weakness (generalized)  Other symptoms and signs involving the musculoskeletal system     Problem List Patient Active Problem List   Diagnosis Date Noted  . Alzheimer disease 04/16/2017  . Chronic left-sided low back pain with left-sided sciatica 11/04/2016  . Pain in left hip 11/04/2016  . Spondylolisthesis of lumbar region 11/04/2016  . Spondylosis without myelopathy or radiculopathy, lumbar region 11/04/2016  . Change in stool caliber 01/05/2015  . Encounter for screening colonoscopy 04/30/2012  . RLQ fullness 04/30/2012  . PALPITATIONS 02/23/2009  . VISUAL IMPAIRMENT 05/11/2008  . OSTEOARTHRITIS, KNEES, BILATERAL 02/18/2008  . ELEVATED BLOOD PRESSURE WITHOUT DIAGNOSIS OF HYPERTENSION 11/17/2007  . MEMORY LOSS 08/17/2007  . MRI, BRAIN, ABNORMAL 08/17/2007  . OSTEOPENIA 07/08/2007  . ANXIETY DEPRESSION 05/08/2007  . HYPERLIPIDEMIA 04/24/2007  . DECREASED HEARING 04/24/2007  . PULMONARY NODULE 04/24/2007    Kipp Brood, PT, DPT Physical Therapist with [facility]  08/11/2017 6:26 PM    [place], [place], [address] Phone: [phone]   Fax:  [phone]  Name: Jennifer Cruz MRN: [id number] Date of Birth: 23-Aug-1930

## 2017-08-11 NOTE — Patient Instructions (Signed)
    SCAPULAR PROTRACTION - SERRATUS PUNCHES: 1-2 sets of 10-20 repetitions (no weight)  Lie on your back with your arm extended out in front of your body and towards the ceiling. While keeping your elbow straight, protract your shoulders forward towards the ceiling and then lower back down in a control motion.   Do not allow your shoulder to raise towards your ears.   Keep your elbow straight the entire time.

## 2017-08-13 ENCOUNTER — Other Ambulatory Visit: Payer: Self-pay

## 2017-08-13 ENCOUNTER — Encounter (HOSPITAL_COMMUNITY): Payer: Self-pay

## 2017-08-13 ENCOUNTER — Ambulatory Visit (HOSPITAL_COMMUNITY): Payer: Medicare HMO

## 2017-08-13 DIAGNOSIS — M6281 Muscle weakness (generalized): Secondary | ICD-10-CM

## 2017-08-13 DIAGNOSIS — R29898 Other symptoms and signs involving the musculoskeletal system: Secondary | ICD-10-CM | POA: Diagnosis not present

## 2017-08-13 DIAGNOSIS — M25511 Pain in right shoulder: Secondary | ICD-10-CM

## 2017-08-13 NOTE — Therapy (Signed)
[facility] [address] [place], [place], [address] Phone: [phone]   Fax:  [phone]  Physical Therapy Treatment  Patient Details  Cruz: Jennifer Cruz MRN: [id number] Date of Birth: 03/29/1931 Referring Provider: Joni Fears   Encounter Date: 08/13/2017  PT End of Session - 08/13/17 1204    Visit Number  5    Number of Visits  17    Date for PT Re-Evaluation  08/11/17    Authorization Type  [employer] medicare     Authorization Time Period  12/127/18 - 09/08/17    Authorization - Visit Number  5    Authorization - Number of Visits  10    PT Start Time  1120    PT Stop Time  2595    PT Time Calculation (min)  39 min    Activity Tolerance  Patient tolerated treatment well;No increased pain    Behavior During Therapy  WFL for tasks assessed/performed       Past Medical History:  Diagnosis Date  . Alzheimer disease 04/16/2017  . Anxiety   . Depression   . Diverticulosis   . Dysrhythmia    palpitations  . Hyperlipidemia   . Hypertension   . Macular degeneration   . Memory loss    work up negative for Alzheimer's per medical records.  . Osteoarthritis   . Pulmonary nodules    per patient work-up completed at [facility] and no further evaluation needed    Past Surgical History:  Procedure Laterality Date  . BREAST ENHANCEMENT SURGERY    . CATARACT EXTRACTION  2009  . COLONOSCOPY WITH PROPOFOL N/A 09/17/2012   Dr.Rourk- normal rectum, pancolonic diverticulosis: o/w the remainder of the colonic mucosa appeared normal. it is notable the left colon was somewhat stiff and noncompliant.   . incomplete colonoscopy  2005   inadequate sedation, [facility]  . REFRACTIVE SURGERY  2010  . TONSILLECTOMY    . TOTAL KNEE ARTHROPLASTY  05/2009   right    There were no vitals filed for this visit.  Subjective Assessment - 08/13/17 1210    Subjective  Patient states she is doing well and is still participating in HEP every day. Her  husband is present at start and for half of session. They share new article on research for shoulder pain and ask about building a bar to hang form in a door frame, I discouraged this. Patient's husand asks about buildign pulley system for AAROM at home, I educated this is ok.    Patient is accompained by:  Family member husband    Pertinent History  R TKA in 2010, Left knee pain, Left foot OA.     How long can you sit comfortably?  unlimited    How long can you stand comfortably?  unlimted    How long can you walk comfortably?  unlimited    Diagnostic tests  Imaing of Left knee and Lumbar spine with noted anterolisthesis (Gr2) at L5/S1; MRI on right shoulder, indicating torn rotator cuff    Patient Stated Goals  get stronger and be pain free when raising arm overhead    Currently in Pain?  No/denies painful when moving       [facility] Adult PT Treatment/Exercise - 08/13/17 0001      Shoulder Exercises: Supine   Other Supine Exercises  serratus punch up, 15 repetitions with 5 second holds      Shoulder Exercises: Sidelying   External Rotation  AROM;Strengthening;Limitations;10 reps;Both  External Rotation Limitations  2 sets, towel under elbo for positioning    Flexion  AROM;Both    Other Sidelying Exercises  rhythmic isometrics, shoulder flexed to ~30-80*, 3x 30 seconds BUE      Shoulder Exercises: Pulleys   Flexion  2 minutes;Limitations 2 minutes flexion/2 minutes abduction    Flexion Limitations  AROM, 2x 67minute      Manual Therapy   Manual Therapy  Joint mobilization    Manual therapy comments  complete seperate of other services    Joint Mobilization  inferior glide grade III to right glenohumeral joint, 3x 45 seconds at ~ 90* abduction, distraction with posterior glide grade III to flenohumeral joint, 3x 45 seconds        PT Education - 08/13/17 1202    Education provided  Yes    Education Details  Educated on HEP compliance and frequency of 1-2 times a day at most. Educated  on exercise form/technique throughout session. Educated on safe exercises to participate in at home as patietns husband inquired about building a pulley system or hang bar for patient to use. Educated pulley system is safe but a pullup/hang bar is not at time time.    Person(s) Educated  Patient;Spouse    Methods  Explanation    Comprehension  Verbalized understanding;Need further instruction       PT Short Term Goals - 08/11/17 1819      PT SHORT TERM GOAL #1   Title  After 2 weeks pt will demonstrat independence with HEP.     Time  3    Period  Weeks    Status  Achieved      PT SHORT TERM GOAL #2   Title  patient will improve AROM by 10 degrees for all limited movements to be able to do makeup and ADL's.    Baseline  08/11/17 - improvements in RUE, no improvements with LUE    Time  4    Period  Weeks    Status  Achieved      PT SHORT TERM GOAL #3   Title  patient will improve MMT for groups tested by 1/2 grade for all limited movements to be able to do makeup and ADL's.    Baseline  08/11/17 - no change    Time  4    Period  Weeks    Status  On-going        PT Long Term Goals - 08/11/17 1820      PT LONG TERM GOAL #1   Title  After six weeks pt will demonstrate indep in advanced HEP for DC.     Time  8    Period  Weeks    Status  On-going      PT LONG TERM GOAL #2   Title  Patient BUE shoulder AROM will be within functional limits to improve performance with ADL's: washing hair, blow drying hair, applying make up.    Baseline  08/11/17 - patient reports improved ability to perform tasks, remains painful    Time  8    Period  Weeks    Status  On-going      PT LONG TERM GOAL #3   Title  Patient will have no painful arc with AROM of right shoulder to perform ADL's with no pain to improve QOL.    Baseline  08/11/17 - painful arc BUE    Time  8    Period  Weeks    Status  On-going        Plan - 08/13/17 1205    Clinical Impression Statement  Patient continues to  make gradual progress in therapy and remains limited by painful arc in BU. She had tenderness to palpation of subscapularis insertion along lesser tubercle today BUE. Exercises are being slowly advanced for conservative management to allow for tissue healing. If no further improvements are seen in next two sessions consider referring back to MD. She will continue to benefit from conservative skilled PT services at this time to gradually advance exercises, improve scapular stability, and progress towards remaining goals.    Rehab Potential  Good    Clinical Impairments Affecting Rehab Potential  (+) supportive family, (+) motivation to participate    PT Frequency  2x / week    PT Duration  8 weeks    PT Treatment/Interventions  ADLs/Self Care Home Management;Cryotherapy;Electrical Stimulation;Functional mobility training;Therapeutic activities;Therapeutic exercise;Neuromuscular re-education;Patient/family education;Manual techniques;Taping    PT Next Visit Plan  Initiate sidelying AROM with scapular assistance. Perform scapular stabilizer strengthening. Continue AAROM and shuodler isometrics. Begin sidelying shuolder flexion with scapular assist to prevent impingement. Continue with serratus punches and when pain free introduce anterior deltoid strengthening. Continue with PROM for IR and flexion/abduction.    PT Home Exercise Plan  AAROM with cane for flexion, abduction and ER.  08/06/17 - shoulder isometrics: IR/flex/ext/abd, sidelying ER AROM;     Consulted and Agree with Plan of Care  Patient;Family member/caregiver    Family Member Consulted  husband       Patient will benefit from skilled therapeutic intervention in order to improve the following deficits and impairments:  Decreased mobility, Improper body mechanics, Impaired UE functional use, Pain, Decreased range of motion, Decreased activity tolerance, Decreased strength, Decreased endurance, Impaired flexibility  Visit Diagnosis: Acute pain  of right shoulder  Muscle weakness (generalized)  Other symptoms and signs involving the musculoskeletal system     Problem List Patient Active Problem List   Diagnosis Date Noted  . Alzheimer disease 04/16/2017  . Chronic left-sided low back pain with left-sided sciatica 11/04/2016  . Pain in left hip 11/04/2016  . Spondylolisthesis of lumbar region 11/04/2016  . Spondylosis without myelopathy or radiculopathy, lumbar region 11/04/2016  . Change in stool caliber 01/05/2015  . Encounter for screening colonoscopy 04/30/2012  . RLQ fullness 04/30/2012  . PALPITATIONS 02/23/2009  . VISUAL IMPAIRMENT 05/11/2008  . OSTEOARTHRITIS, KNEES, BILATERAL 02/18/2008  . ELEVATED BLOOD PRESSURE WITHOUT DIAGNOSIS OF HYPERTENSION 11/17/2007  . MEMORY LOSS 08/17/2007  . MRI, BRAIN, ABNORMAL 08/17/2007  . OSTEOPENIA 07/08/2007  . ANXIETY DEPRESSION 05/08/2007  . HYPERLIPIDEMIA 04/24/2007  . DECREASED HEARING 04/24/2007  . PULMONARY NODULE 04/24/2007    Kipp Brood, PT, DPT Physical Therapist with [facility]  08/13/2017 12:13 PM    [facility] [place], [place], [address] Phone: [phone]   Fax:  [phone]  Cruz: Jennifer Cruz MRN: [id number] Date of Birth: 1931/04/02

## 2017-08-18 ENCOUNTER — Encounter (HOSPITAL_COMMUNITY): Payer: Self-pay

## 2017-08-18 ENCOUNTER — Ambulatory Visit (HOSPITAL_COMMUNITY): Payer: Medicare HMO

## 2017-08-18 ENCOUNTER — Other Ambulatory Visit: Payer: Self-pay

## 2017-08-18 DIAGNOSIS — M6281 Muscle weakness (generalized): Secondary | ICD-10-CM | POA: Diagnosis not present

## 2017-08-18 DIAGNOSIS — M25511 Pain in right shoulder: Secondary | ICD-10-CM | POA: Diagnosis not present

## 2017-08-18 DIAGNOSIS — R29898 Other symptoms and signs involving the musculoskeletal system: Secondary | ICD-10-CM | POA: Diagnosis not present

## 2017-08-18 NOTE — Therapy (Signed)
[facility] [address] [place], [place], [address] Phone: [phone]   Fax:  [phone]  Physical Therapy Treatment  Patient Details  Name: Jennifer Cruz MRN: [id number] Date of Birth: 04/30/1931 Referring Provider: Joni Fears   Encounter Date: 08/18/2017  PT End of Session - 08/18/17 1150    Visit Number  6    Number of Visits  17    Date for PT Re-Evaluation  08/11/17    Authorization Type  [employer] medicare     Authorization Time Period  12/127/18 - 09/08/17    Authorization - Visit Number  6    Authorization - Number of Visits  10    PT Start Time  2229    PT Stop Time  7989    PT Time Calculation (min)  39 min    Activity Tolerance  Patient tolerated treatment well;No increased pain;Patient limited by pain monitored pain throughout, limited activity to pain tolerance    Behavior During Therapy  [facility] for tasks assessed/performed       Past Medical History:  Diagnosis Date  . Alzheimer disease 04/16/2017  . Anxiety   . Depression   . Diverticulosis   . Dysrhythmia    palpitations  . Hyperlipidemia   . Hypertension   . Macular degeneration   . Memory loss    work up negative for Alzheimer's per medical records.  . Osteoarthritis   . Pulmonary nodules    per patient work-up completed at [facility] and no further evaluation needed    Past Surgical History:  Procedure Laterality Date  . BREAST ENHANCEMENT SURGERY    . CATARACT EXTRACTION  2009  . COLONOSCOPY WITH PROPOFOL N/A 09/17/2012   Dr.Rourk- normal rectum, pancolonic diverticulosis: o/w the remainder of the colonic mucosa appeared normal. it is notable the left colon was somewhat stiff and noncompliant.   . incomplete colonoscopy  2005   inadequate sedation, [facility]  . REFRACTIVE SURGERY  2010  . TONSILLECTOMY    . TOTAL KNEE ARTHROPLASTY  05/2009   right    There were no vitals filed for this visit.  Subjective Assessment - 08/18/17 1132    Subjective   Patient reports she woke up with more pain today than she has had for the last several days. She states her husband has hooked up a pulley system at home and had her do some exercises. She reports overall she is doing better, she can sleep through the night without her pain waking her up.    Pertinent History  R TKA in 2010, Left knee pain, Left foot OA.     How long can you sit comfortably?  unlimited    How long can you stand comfortably?  unlimted    How long can you walk comfortably?  unlimited    Diagnostic tests  Imaing of Left knee and Lumbar spine with noted anterolisthesis (Gr2) at L5/S1; MRI on right shoulder, indicating torn rotator cuff    Patient Stated Goals  get stronger and be pain free when raising arm overhead    Currently in Pain?  Yes    Pain Score  7     Pain Location  Shoulder    Pain Orientation  Right;Left    Pain Descriptors / Indicators  Aching    Pain Type  Chronic pain    Pain Onset  More than a month ago    Pain Frequency  Intermittent when using it    Multiple Pain Sites  No        [facility] Adult PT Treatment/Exercise - 08/18/17 0001      Shoulder Exercises: Supine   Other Supine Exercises  serratus punch up, 12x 10 repetitions with 3 second holds      Shoulder Exercises: Seated   Retraction  AROM;Both;Limitations    Retraction Limitations  scapular retraction/depression, 2x 10 reps with 3 second holds      Shoulder Exercises: Prone   Other Prone Exercises  modified prone, pedulum BUE 2x 20 reps      Shoulder Exercises: Sidelying   External Rotation  AROM;Strengthening;Limitations;10 reps;Both    External Rotation Limitations  2 sets, towel under elbow for positioning    Internal Rotation  AROM;Both;10 reps;Limitations    Internal Rotation Limitations  2 sets      Shoulder Exercises: Standing   Internal Rotation  -- too painful    Extension  10 reps;Limitations;Theraband;AROM;Strengthening;Both    Theraband Level (Shoulder Extension)  Level 2 (Red)     Other Standing Exercises  wall push up x 10 reps       PT Education - 08/18/17 1158    Education provided  Yes    Education Details  Educated on form/technique wtih exercises throughout. Discussed benefit of postural training for scapular positioning with sholder mobility. Educated to decrease HEP activity and not perform pulley's at home to decrease pain and prevent overworking he RC muscles.     Person(s) Educated  Patient    Methods  Explanation    Comprehension  Verbalized understanding;Need further instruction;Tactile cues required;Verbal cues required       PT Short Term Goals - 08/11/17 1819      PT SHORT TERM GOAL #1   Title  After 2 weeks pt will demonstrat independence with HEP.     Time  3    Period  Weeks    Status  Achieved      PT SHORT TERM GOAL #2   Title  patient will improve AROM by 10 degrees for all limited movements to be able to do makeup and ADL's.    Baseline  08/11/17 - improvements in RUE, no improvements with LUE    Time  4    Period  Weeks    Status  Achieved      PT SHORT TERM GOAL #3   Title  patient will improve MMT for groups tested by 1/2 grade for all limited movements to be able to do makeup and ADL's.    Baseline  08/11/17 - no change    Time  4    Period  Weeks    Status  On-going        PT Long Term Goals - 08/11/17 1820      PT LONG TERM GOAL #1   Title  After six weeks pt will demonstrate indep in advanced HEP for DC.     Time  8    Period  Weeks    Status  On-going      PT LONG TERM GOAL #2   Title  Patient BUE shoulder AROM will be within functional limits to improve performance with ADL's: washing hair, blow drying hair, applying make up.    Baseline  08/11/17 - patient reports improved ability to perform tasks, remains painful    Time  8    Period  Weeks    Status  On-going      PT LONG TERM GOAL #3   Title  Patient will have no  painful arc with AROM of right shoulder to perform ADL's with no pain to improve QOL.     Baseline  08/11/17 - painful arc BUE    Time  8    Period  Weeks    Status  On-going       Plan - 08/18/17 1152    Clinical Impression Statement  Patient arrived with more pain today and reported performing pulley exercises at home per her husband's instruction. She was educated to remove pulley's from HEP and perform isometrics only to reduce pain. Jennifer Cruz continues to be limited by a painful arc in BUE and has tenderness to palpation of subscapularis insertion along lesser tubercle on her right. Exercises are being slowly advanced for conservative management to allow for tissue healing. If patients reports no improvement in pain, encourage patient to schedule follow up with MD. Patient had good response to scapular stabilization and postural strengthening exercises. She will continue to benefit from conservative skilled PT services at this time to gradually advance exercises, improve scapular stability, and progress towards remaining goals.    Rehab Potential  Good    Clinical Impairments Affecting Rehab Potential  (+) supportive family, (+) motivation to participate    PT Frequency  2x / week    PT Duration  8 weeks    PT Treatment/Interventions  ADLs/Self Care Home Management;Cryotherapy;Electrical Stimulation;Functional mobility training;Therapeutic activities;Therapeutic exercise;Neuromuscular re-education;Patient/family education;Manual techniques;Taping    PT Next Visit Plan  Continue with scapular stabilizer and postural strengthening. Continue AAROM and shuodler isometrics. Begin sidelying shuolder flexion with scapular assist to prevent impingement. Continue with serratus punches and when pain free introduce anterior deltoid strengthening. Continue with PROM for IR and flexion/abduction.    PT Home Exercise Plan  AAROM with cane for flexion, abduction and ER.  08/06/17 - shoulder isometrics: IR/flex/ext/abd, sidelying ER AROM;     Consulted and Agree with Plan of Care  Patient       Patient  will benefit from skilled therapeutic intervention in order to improve the following deficits and impairments:  Decreased mobility, Improper body mechanics, Impaired UE functional use, Pain, Decreased range of motion, Decreased activity tolerance, Decreased strength, Decreased endurance, Impaired flexibility  Visit Diagnosis: Acute pain of right shoulder  Muscle weakness (generalized)  Other symptoms and signs involving the musculoskeletal system     Problem List Patient Active Problem List   Diagnosis Date Noted  . Alzheimer disease 04/16/2017  . Chronic left-sided low back pain with left-sided sciatica 11/04/2016  . Pain in left hip 11/04/2016  . Spondylolisthesis of lumbar region 11/04/2016  . Spondylosis without myelopathy or radiculopathy, lumbar region 11/04/2016  . Change in stool caliber 01/05/2015  . Encounter for screening colonoscopy 04/30/2012  . RLQ fullness 04/30/2012  . PALPITATIONS 02/23/2009  . VISUAL IMPAIRMENT 05/11/2008  . OSTEOARTHRITIS, KNEES, BILATERAL 02/18/2008  . ELEVATED BLOOD PRESSURE WITHOUT DIAGNOSIS OF HYPERTENSION 11/17/2007  . MEMORY LOSS 08/17/2007  . MRI, BRAIN, ABNORMAL 08/17/2007  . OSTEOPENIA 07/08/2007  . ANXIETY DEPRESSION 05/08/2007  . HYPERLIPIDEMIA 04/24/2007  . DECREASED HEARING 04/24/2007  . PULMONARY NODULE 04/24/2007    Kipp Brood, PT, DPT Physical Therapist with [facility]  08/18/2017 12:07 PM     [facility] [address] [place], [place], [address] Phone: [phone]   Fax:  [phone]  Name: Jennifer Cruz MRN: [id number] Date of Birth: [birth year]

## 2017-08-20 ENCOUNTER — Encounter (HOSPITAL_COMMUNITY): Payer: Self-pay

## 2017-08-20 ENCOUNTER — Ambulatory Visit (HOSPITAL_COMMUNITY): Payer: Medicare HMO

## 2017-08-20 DIAGNOSIS — R29898 Other symptoms and signs involving the musculoskeletal system: Secondary | ICD-10-CM

## 2017-08-20 DIAGNOSIS — M6281 Muscle weakness (generalized): Secondary | ICD-10-CM

## 2017-08-20 DIAGNOSIS — M25511 Pain in right shoulder: Secondary | ICD-10-CM

## 2017-08-20 NOTE — Therapy (Signed)
[facility] [address] [place], [place], [address] Phone: [phone]   Fax:  [phone]  Physical Therapy Treatment  Patient Details  Name: Jennifer Cruz MRN: [id number] Date of Birth: [birth year] Referring Provider: Joni Fears   Encounter Date: 08/20/2017  PT End of Session - 08/20/17 1142    Visit Number  7    Number of Visits  17    Date for PT Re-Evaluation  09/08/17    Authorization Type  [employer]     Authorization Time Period  12/127/18 - 09/08/17    Authorization - Visit Number  7    Authorization - Number of Visits  10    PT Start Time  5956 Pt forgot to sign-in, delayed start of tx    PT Stop Time  1210    PT Time Calculation (min)  33 min    Activity Tolerance  Patient tolerated treatment well;No increased pain;Patient limited by pain cueing to reduce guarding, reports pain wtih movement    Behavior During Therapy  [facility] for tasks assessed/performed       Past Medical History:  Diagnosis Date  . Alzheimer disease 04/16/2017  . Anxiety   . Depression   . Diverticulosis   . Dysrhythmia    palpitations  . Hyperlipidemia   . Hypertension   . Macular degeneration   . Memory loss    work up negative for Alzheimer's per medical records.  . Osteoarthritis   . Pulmonary nodules    per patient work-up completed at [facility] and no further evaluation needed    Past Surgical History:  Procedure Laterality Date  . BREAST ENHANCEMENT SURGERY    . CATARACT EXTRACTION  2009  . COLONOSCOPY WITH PROPOFOL N/A 09/17/2012   Dr.Rourk- normal rectum, pancolonic diverticulosis: o/w the remainder of the colonic mucosa appeared normal. it is notable the left colon was somewhat stiff and noncompliant.   . incomplete colonoscopy  2005   inadequate sedation, [facility]  . REFRACTIVE SURGERY  2010  . TONSILLECTOMY    . TOTAL KNEE ARTHROPLASTY  05/2009   right    There were no vitals filed for this visit.  Subjective Assessment  - 08/20/17 1140    Subjective  Pt stated she is pain free without movements, can increase to 8/10 with shoulder flexion.  Pt stated she has done a lot of housework with increased pain with movements today.    Pertinent History  R TKA in 2010, Left knee pain, Left foot OA.     Patient Stated Goals  get stronger and be pain free when raising arm overhead    Currently in Pain?  Yes    Pain Score  8     Pain Location  Arm shoulder down to arm    Pain Orientation  Left;Right    Pain Type  Chronic pain    Pain Onset  More than a month ago    Pain Frequency  Intermittent    Aggravating Factors   reaching up overhead    Pain Relieving Factors  eases up on its own    Effect of Pain on Daily Activities  doesnt wake me up at night anymore                      [facility] Adult PT Treatment/Exercise - 08/20/17 0001      Shoulder Exercises: Supine   External Rotation  AAROM;10 reps    Internal Rotation  AAROM;10 reps  Shoulder Exercises: Seated   Retraction  AROM;Both;Limitations    Retraction Limitations  scapular retraction/depression, 2x 10 reps with 3 second holds    Other Seated Exercises  posterior shoulder rolls with 3" scapular retraction      Shoulder Exercises: Sidelying   Flexion  AAROM;10 reps      Shoulder Exercises: Standing   Extension  10 reps;Theraband 2 sets    Theraband Level (Shoulder Extension)  Level 2 (Red)    Row  10 reps;Theraband 2 sets    Theraband Level (Shoulder Row)  Level 2 (Red)    Other Standing Exercises  wall push up x 10 reps      Manual Therapy   Manual Therapy  Joint mobilization    Manual therapy comments  complete seperate of other services    Joint Mobilization  inferior glide grade III to right glenohumeral joint, 3x 45 seconds at ~ 90* abduction, distraction with posterior glide grade III to flenohumeral joint, 3x 45 seconds               PT Short Term Goals - 08/11/17 1819      PT SHORT TERM GOAL #1   Title  After 2  weeks pt will demonstrat independence with HEP.     Time  3    Period  Weeks    Status  Achieved      PT SHORT TERM GOAL #2   Title  patient will improve AROM by 10 degrees for all limited movements to be able to do makeup and ADL's.    Baseline  08/11/17 - improvements in RUE, no improvements with LUE    Time  4    Period  Weeks    Status  Achieved      PT SHORT TERM GOAL #3   Title  patient will improve MMT for groups tested by 1/2 grade for all limited movements to be able to do makeup and ADL's.    Baseline  08/11/17 - no change    Time  4    Period  Weeks    Status  On-going        PT Long Term Goals - 08/11/17 1820      PT LONG TERM GOAL #1   Title  After six weeks pt will demonstrate indep in advanced HEP for DC.     Time  8    Period  Weeks    Status  On-going      PT LONG TERM GOAL #2   Title  Patient BUE shoulder AROM will be within functional limits to improve performance with ADL's: washing hair, blow drying hair, applying make up.    Baseline  08/11/17 - patient reports improved ability to perform tasks, remains painful    Time  8    Period  Weeks    Status  On-going      PT LONG TERM GOAL #3   Title  Patient will have no painful arc with AROM of right shoulder to perform ADL's with no pain to improve QOL.    Baseline  08/11/17 - painful arc BUE    Time  8    Period  Weeks    Status  On-going            Plan - 08/20/17 1211    Clinical Impression Statement  Pt continues to be limited with pain with movements, cueing to reduce guarding with AAROM.  Continued session foucs with shoulder mobility and strengthening trying  not to increase pain.  Reports of relief with manual offloading during IR/ER though does continues to c/o "pinching" at end range.  Cueing to improve mechnanics and reduce compensation with scapular stability.  Manual technqiues complete to improve scapular movements with AAROM to reduce impinchment.  No reports of pain at EOS unless end  range movements.      Rehab Potential  Good    Clinical Impairments Affecting Rehab Potential  (+) supportive family, (+) motivation to participate    PT Frequency  2x / week    PT Duration  8 weeks    PT Treatment/Interventions  ADLs/Self Care Home Management;Cryotherapy;Electrical Stimulation;Functional mobility training;Therapeutic activities;Therapeutic exercise;Neuromuscular re-education;Patient/family education;Manual techniques;Taping    PT Next Visit Plan  Continue with scapular stabilizer and postural strengthening. Continue AAROM and shuodler isometrics. Continue sidelying shuolder flexion with scapular assist to prevent impingement. Continue with serratus punches and when pain free introduce anterior deltoid strengthening. Continue with PROM for IR and flexion/abduction.    PT Home Exercise Plan  AAROM with cane for flexion, abduction and ER.  08/06/17 - shoulder isometrics: IR/flex/ext/abd, sidelying ER AROM;        Patient will benefit from skilled therapeutic intervention in order to improve the following deficits and impairments:  Decreased mobility, Improper body mechanics, Impaired UE functional use, Pain, Decreased range of motion, Decreased activity tolerance, Decreased strength, Decreased endurance, Impaired flexibility  Visit Diagnosis: Acute pain of right shoulder  Muscle weakness (generalized)  Other symptoms and signs involving the musculoskeletal system     Problem List Patient Active Problem List   Diagnosis Date Noted  . Alzheimer disease 04/16/2017  . Chronic left-sided low back pain with left-sided sciatica 11/04/2016  . Pain in left hip 11/04/2016  . Spondylolisthesis of lumbar region 11/04/2016  . Spondylosis without myelopathy or radiculopathy, lumbar region 11/04/2016  . Change in stool caliber 01/05/2015  . Encounter for screening colonoscopy 04/30/2012  . RLQ fullness 04/30/2012  . PALPITATIONS 02/23/2009  . VISUAL IMPAIRMENT 05/11/2008  .  OSTEOARTHRITIS, KNEES, BILATERAL 02/18/2008  . ELEVATED BLOOD PRESSURE WITHOUT DIAGNOSIS OF HYPERTENSION 11/17/2007  . MEMORY LOSS 08/17/2007  . MRI, BRAIN, ABNORMAL 08/17/2007  . OSTEOPENIA 07/08/2007  . ANXIETY DEPRESSION 05/08/2007  . HYPERLIPIDEMIA 04/24/2007  . DECREASED HEARING 04/24/2007  . PULMONARY NODULE 04/24/2007   Ihor Austin, [place]; [place]  Aldona Lento 08/20/2017, 12:21 PM  [facility] [place], [place], [address] Phone: [phone]   Fax:  [phone]  Name: GABRIANA WILMOTT MRN: [id number] Date of Birth: 04-28-1931

## 2017-08-25 ENCOUNTER — Encounter (HOSPITAL_COMMUNITY): Payer: Self-pay

## 2017-08-25 ENCOUNTER — Other Ambulatory Visit: Payer: Self-pay

## 2017-08-25 ENCOUNTER — Telehealth (HOSPITAL_COMMUNITY): Payer: Self-pay

## 2017-08-25 ENCOUNTER — Ambulatory Visit (HOSPITAL_COMMUNITY): Payer: Medicare HMO

## 2017-08-25 DIAGNOSIS — M6281 Muscle weakness (generalized): Secondary | ICD-10-CM | POA: Diagnosis not present

## 2017-08-25 DIAGNOSIS — R29898 Other symptoms and signs involving the musculoskeletal system: Secondary | ICD-10-CM

## 2017-08-25 DIAGNOSIS — M25511 Pain in right shoulder: Secondary | ICD-10-CM

## 2017-08-25 NOTE — Telephone Encounter (Signed)
Pt's husband called questioning the charge of $40.00 copay on 08/06/17- it was a mistake and should have only been $10.00. I told Mr. Dicenzo,  We will credit the next three visits as paid if MD tells pt to continue. NF

## 2017-08-25 NOTE — Therapy (Addendum)
[facility] [address] [place], [place], [address] Phone: [phone]   Fax:  [phone]  Physical Therapy Treatment/[place] Summary  Patient Details  Name: Jennifer Cruz MRN: [id number] Date of Birth: 03/31/1931 Referring Provider: Joni Fears   PHYSICAL THERAPY DISCHARGE SUMMARY  Visits from Start of Care: 8  Current functional level related to goals / functional outcomes:  Re-assessment of ROM and strength was performed on 08/25/17 as patient has a follow-up appointment scheduled with Dr. Durward Fortes on Wednesday. She has been progressing gradually in therapy and has met 2/3 short term goals and is progressing slowly towards her 3 long term goals. She remains limited by painful arc in BUE and continues to have positive testing for impingement and rotator cuff injury. She has full PROM and greatly limited during AROM by pain bilaterally with shoulder movement. She was educated on limited return of full ROM and strength due to age and severity of muscle tear. She has been able to advance postural strengthening without pain and will benefit from continued postural exercises to reduce shoulder impingement bilaterally.    Remaining deficits: See below details. Pain, decreased ROM and Bil UE strength.   Education / Equipment: See below details   Plan: Patient agrees to discharge.  Patient goals were not met. Patient is being discharged due to the patient's request.  ?????    Patient called and stated she would like to cancel her appointments as she believes she will be having surgery.  Kipp Brood, PT, DPT Physical Therapist with [facility]  09/01/2017 8:14 AM     Encounter Date: 08/25/2017  PT End of Session - 08/25/17 1122    Visit Number  8    Number of Visits  17    Date for PT Re-Evaluation  09/08/17    Authorization Type  [employer] medicare     Authorization Time Period  12/127/18 - 09/08/17     Authorization - Visit Number  8    Authorization - Number of Visits  10    PT Start Time  1120    PT Stop Time  1200    PT Time Calculation (min)  40 min    Activity Tolerance  Patient tolerated treatment well;No increased pain;Patient limited by pain cueing to reduce guarding, reports pain wtih movement    Behavior During Therapy  [facility] for tasks assessed/performed       Past Medical History:  Diagnosis Date  . Alzheimer disease 04/16/2017  . Anxiety   . Depression   . Diverticulosis   . Dysrhythmia    palpitations  . Hyperlipidemia   . Hypertension   . Macular degeneration   . Memory loss    work up negative for Alzheimer's per medical records.  . Osteoarthritis   . Pulmonary nodules    per patient work-up completed at [facility] and no further evaluation needed    Past Surgical History:  Procedure Laterality Date  . BREAST ENHANCEMENT SURGERY    . CATARACT EXTRACTION  2009  . COLONOSCOPY WITH PROPOFOL N/A 09/17/2012   Dr.Rourk- normal rectum, pancolonic diverticulosis: o/w the remainder of the colonic mucosa appeared normal. it is notable the left colon was somewhat stiff and noncompliant.   . incomplete colonoscopy  2005   inadequate sedation, [facility]  . REFRACTIVE SURGERY  2010  . TONSILLECTOMY    . TOTAL KNEE ARTHROPLASTY  05/2009   right    There were no vitals filed for this visit.  Subjective Assessment - 08/25/17 1903    Subjective  Patient reports she has a follow up with Dr. Durward Fortes on Wednesday and that she would like a re-assessment sent to him to update him on her progress and current functional level. She reports she did a lot of cleaning and organzing around her house this past week/weekend and is sore from it. She did not get to perform her HEP as regularly because of all of her work.     Pertinent History  R TKA in 2010, Left knee pain, Left foot OA.     Limitations  Lifting;House hold activities    How long can you sit comfortably?  unlimited    How long  can you stand comfortably?  unlimted    How long can you walk comfortably?  unlimited    Diagnostic tests  Imaing of Left knee and Lumbar spine with noted anterolisthesis (Gr2) at L5/S1; MRI on right shoulder, indicating torn rotator cuff    Patient Stated Goals  get stronger and be pain free when raising arm overhead    Currently in Pain?  Yes    Pain Score  5     Pain Location  Shoulder    Pain Orientation  Right;Left    Pain Descriptors / Indicators  Aching;Discomfort    Pain Type  Chronic pain    Pain Onset  More than a month ago    Pain Frequency  Intermittent    Aggravating Factors   reaching up    Pain Relieving Factors  rest    Effect of Pain on Daily Activities  doesn't wake me up at night anymore, but still hurts sometimes to do hair and make-up         [facility] PT Assessment - 08/25/17 0001      Assessment   Medical Diagnosis  Right Rotator Cuff pain    Referring Provider  Joni Fears    Onset Date/Surgical Date  05/26/17    Hand Dominance  Right    Next MD Visit  08/27/17    Prior Therapy  For R TKA several years ago, and for LBP in last year      Precautions   Precautions  None      Restrictions   Weight Bearing Restrictions  No      Observation/Other Assessments   Focus on Therapeutic Outcomes (FOTO)   44% limited on 08/11/17 51% limited on 07/14/17      AROM   Overall AROM Comments  PROM normal throughout, pain at end range    Right/Left Shoulder  Right;Left    Right Shoulder Extension  60 Degrees    Right Shoulder Flexion  170 Degrees painful    Right Shoulder ABduction  90 Degrees painful.    Right Shoulder Internal Rotation  85 Degrees    Right Shoulder External Rotation  85 Degrees painful    Left Shoulder Extension  60 Degrees    Left Shoulder Flexion  170 Degrees    Left Shoulder ABduction  90 Degrees    Left Shoulder Internal Rotation  90 Degrees    Left Shoulder External Rotation  55 Degrees "pinch" pain      Strength   Right Shoulder Flexion   3+/5    Right Shoulder ABduction  3+/5    Right Shoulder Internal Rotation  3+/5    Right Shoulder External Rotation  3+/5    Left Shoulder Flexion  3+/5    Left Shoulder ABduction  3+/5  Left Shoulder Internal Rotation  3+/5    Left Shoulder External Rotation  3+/5      Palpation   Palpation comment  Tenderness to palpation along subscapularis and infraspinatus insertions along BUE      Painful Arc of Motion   Findings  Positive    Comments  both sides, 30-160 degrees        [facility] Adult PT Treatment/Exercise - 08/25/17 0001      Shoulder Exercises: Supine   Other Supine Exercises  serratus punch up, 1x 10 repetitions with 3 second holds, 1 lb, BUE      Shoulder Exercises: Seated   Retraction  AROM;Both;Limitations    Retraction Limitations  scapular retraction/depression, 2x 10 reps with 3 second holds    Other Seated Exercises  posterior shoulder rolls with 3" scapular retraction      Shoulder Exercises: Sidelying   External Rotation  AROM;Strengthening;Limitations;Both;15 reps    External Rotation Limitations  2 sets, towel under elbow for positioning    Internal Rotation  AROM;Both;Limitations;15 reps    Internal Rotation Limitations  2 sets      Shoulder Exercises: Standing   Extension  10 reps;Theraband 2 sets    Theraband Level (Shoulder Extension)  Level 2 (Red)    Row  10 reps;Theraband 2 sets    Theraband Level (Shoulder Row)  Level 2 (Red)    Other Standing Exercises  wall push up; 2 x 10 reps         PT Education - 08/25/17 1906    Education provided  Yes    Education Details  Educated on form/technique throughout. Educated on progress in therapy and limited recovery due to her age and the size of the tear. Educated on next 2 weeks of therapy beign focused on pain management and postural training to decrease impingment of shoudler joint that may be contributing to pain.     Person(s) Educated  Patient    Methods  Explanation    Comprehension   Verbalized understanding;Returned demonstration       PT Short Term Goals - 08/25/17 1909      PT SHORT TERM GOAL #1   Title  After 2 weeks pt will demonstrate independence with HEP.     Time  3    Period  Weeks    Status  Achieved      PT SHORT TERM GOAL #2   Title  patient will improve AROM by 10 degrees for all limited movements to be able to do makeup and ADL's.    Baseline  08/11/17 - improvements in RUE, no improvements with LUE    Time  4    Period  Weeks    Status  Achieved      PT SHORT TERM GOAL #3   Title  patient will improve MMT for groups tested by 1/2 grade for all limited movements to be able to do makeup and ADL's.    Baseline  08/11/17 - no change, 08/25/17 - no change    Time  4    Period  Weeks    Status  On-going        PT Long Term Goals - 08/25/17 1910      PT LONG TERM GOAL #1   Title  After six weeks pt will demonstrate indep in advanced HEP for DC.     Time  8    Period  Weeks    Status  On-going      PT LONG  TERM GOAL #2   Title  Patient BUE shoulder AROM will be within functional limits to improve performance with ADL's: washing hair, blow drying hair, applying make up.    Baseline  08/11/17 - patient reports improved ability to perform tasks, remains painful    Time  8    Period  Weeks    Status  On-going      PT LONG TERM GOAL #3   Title  Patient will have no painful arc with AROM of right shoulder to perform ADL's with no pain to improve QOL.    Baseline  08/11/17 - painful arc BUE    Time  8    Period  Weeks    Status  On-going            Plan - 08/25/17 1928    Clinical Impression Statement  Re-assessment of ROM and strength was performed today as patient has a follow-up appointment scheduled with Dr. Durward Fortes on Wednesday. She has been progressing gradually in therapy and has met 2/3 short term goals and is progressing slowly towards her 3 long term goals. She remains limited by painful arc in BUE and continues to have positive  testing for impingement and rotator cuff injury. She has full PROM and greatly limited during AROM by pain bilaterally with shoulder movement. She was educated on limited return of full ROM and strength due to age and severity of muscle tear. She has been able to advance postural strengthening without pain and will benefit from continued postural exercises to reduce shoulder impingement bilaterally. She will continue to benefit from conservative skilled PT services to gradually advance exercises, improve scapular stability, and progress towards remaining goals.    Rehab Potential  Good    Clinical Impairments Affecting Rehab Potential  (+) supportive family, (+) motivation to participate    PT Frequency  2x / week    PT Duration  8 weeks    PT Treatment/Interventions  ADLs/Self Care Home Management;Cryotherapy;Electrical Stimulation;Functional mobility training;Therapeutic activities;Therapeutic exercise;Neuromuscular re-education;Patient/family education;Manual techniques;Taping    PT Next Visit Plan  Continue with scapular stabilizer and postural strengthening. Continue AAROM and shuodler isometrics. Continue with serratus punches and provide manual therapy for pain relief PRN.    PT Home Exercise Plan  AAROM with cane for flexion, abduction and ER.  08/06/17 - shoulder isometrics: IR/flex/ext/abd, sidelying ER AROM;     Consulted and Agree with Plan of Care  Patient       Patient will benefit from skilled therapeutic intervention in order to improve the following deficits and impairments:  Decreased mobility, Improper body mechanics, Impaired UE functional use, Pain, Decreased range of motion, Decreased activity tolerance, Decreased strength, Decreased endurance, Impaired flexibility  Visit Diagnosis: Acute pain of right shoulder  Muscle weakness (generalized)  Other symptoms and signs involving the musculoskeletal system     Problem List Patient Active Problem List   Diagnosis Date Noted   . Alzheimer disease 04/16/2017  . Chronic left-sided low back pain with left-sided sciatica 11/04/2016  . Pain in left hip 11/04/2016  . Spondylolisthesis of lumbar region 11/04/2016  . Spondylosis without myelopathy or radiculopathy, lumbar region 11/04/2016  . Change in stool caliber 01/05/2015  . Encounter for screening colonoscopy 04/30/2012  . RLQ fullness 04/30/2012  . PALPITATIONS 02/23/2009  . VISUAL IMPAIRMENT 05/11/2008  . OSTEOARTHRITIS, KNEES, BILATERAL 02/18/2008  . ELEVATED BLOOD PRESSURE WITHOUT DIAGNOSIS OF HYPERTENSION 11/17/2007  . MEMORY LOSS 08/17/2007  . MRI, BRAIN, ABNORMAL 08/17/2007  . OSTEOPENIA 07/08/2007  .  ANXIETY DEPRESSION 05/08/2007  . HYPERLIPIDEMIA 04/24/2007  . DECREASED HEARING 04/24/2007  . PULMONARY NODULE 04/24/2007    Kipp Brood, PT, DPT Physical Therapist with [facility]  08/25/2017 7:29 PM    [facility] [address] [place], [place], [address] Phone: [phone]   Fax:  [phone]  Name: Jennifer Cruz MRN: [id number] Date of Birth: November 19, 1930

## 2017-08-26 ENCOUNTER — Telehealth (HOSPITAL_COMMUNITY): Payer: Self-pay | Admitting: Internal Medicine

## 2017-08-26 NOTE — Telephone Encounter (Signed)
08/26/17  pt called and cx said that she had a conflict

## 2017-08-27 ENCOUNTER — Ambulatory Visit (INDEPENDENT_AMBULATORY_CARE_PROVIDER_SITE_OTHER): Payer: Self-pay

## 2017-08-27 ENCOUNTER — Ambulatory Visit (INDEPENDENT_AMBULATORY_CARE_PROVIDER_SITE_OTHER): Payer: Medicare HMO | Admitting: Orthopaedic Surgery

## 2017-08-27 ENCOUNTER — Ambulatory Visit (HOSPITAL_COMMUNITY): Payer: Medicare HMO

## 2017-08-27 ENCOUNTER — Encounter (INDEPENDENT_AMBULATORY_CARE_PROVIDER_SITE_OTHER): Payer: Self-pay | Admitting: Orthopaedic Surgery

## 2017-08-27 VITALS — BP 139/70 | HR 76 | Resp 12 | Ht 64.0 in | Wt 125.0 lb

## 2017-08-27 DIAGNOSIS — G8929 Other chronic pain: Secondary | ICD-10-CM

## 2017-08-27 DIAGNOSIS — M25511 Pain in right shoulder: Secondary | ICD-10-CM | POA: Diagnosis not present

## 2017-08-27 NOTE — Progress Notes (Signed)
Office Visit Note   Patient: Jennifer Cruz           Date of Birth: 1931/01/29           MRN: [id number] Visit Date: 08/27/2017              Requested by: Asencion Noble, MD [address] [place], [place] [address] PCP: Asencion Noble, MD   Assessment & Plan: Visit Diagnoses:  1. Chronic right shoulder pain   prior MRI scan in December 2018 demonstrated a tear of the supraspinatus tendon with at least 19 mm of retraction. There was a partial tear of the infraspinatus and supraspinatus tendons as well.degenerative changes at the acromioclavicular joint and glenohumeral joint were demonstrated. No muscle atrophy and identified. Has tried a course of physical therapy without much relief. Now would like to discuss surgery. Over 30 minute discussion regarding treatment options and surgery in some detail regarding the surgery. Certainly no guarantees. Has a ignificant tear with inability to actively place arm overhead .will schedule surgery to include mini open rotator cuff tear repair if possible and possible application of patch. Biceps tendon could be tenodesed or released  Plan: Follow-Up Instructions: Return will scheduler surgery.   Orders:  Orders Placed This Encounter  Procedures  . XR Shoulder Right   No orders of the defined types were placed in this encounter.     Procedures: No procedures performed   Clinical Data: No additional findings.   Subjective: Chief Complaint  Patient presents with  . Right Shoulder - Pain    Mrs. Blasdel is an 84 y o here today for F/Uof Right shoulder pain. Pt wants Dr. Durward Fortes to look at last PT note.  right shoulder pain occurred after falling in November 2018. Has had prior cortisone injection without much relief. MRI scan was performed with results as above. Has tried a course of physical therapy without much relief. Mrs. Maloof would like to have better function and less pain if possible. No numbness or tingling  HPI  Review of  Systems  Constitutional: Negative for chills, fatigue and fever.  Eyes: Negative for itching.  Respiratory: Negative for chest tightness and shortness of breath.   Cardiovascular: Negative for chest pain, palpitations and leg swelling.  Gastrointestinal: Negative for blood in stool, constipation and diarrhea.  Endocrine: Negative for polyuria.  Genitourinary: Negative for dysuria.  Musculoskeletal: Negative for back pain, joint swelling, neck pain and neck stiffness.  Allergic/Immunologic: Negative for immunocompromised state.  Neurological: Negative for dizziness and numbness.       Memory loss  Hematological: Does not bruise/bleed easily.  Psychiatric/Behavioral: The patient is not nervous/anxious.      Objective: Vital Signs: BP 139/70   Pulse 76   Resp 12   Ht 5\' 4"  (1.626 m)   Wt 125 lb (56.7 kg)   BMI 21.46 kg/m   Physical Exam  [facility]o [place] alert and oriented 3. Comfortable sitting. Accompanied by her husband. I could place the right arm fully overhead without evidence of adhesive capsulitis. Mrs. Brabec could maintain that position. Does have weakness with external rotation. Could not initiate overhead motion by her self. Skin intact. Neurovascular exam intact. Difficult to tell of biceps tendon has ruptured as the biceps muscle appears to be in its normal position.  Specialty Comments:  No specialty comments available.  Imaging: Xr Shoulder Right  Result Date: 08/27/2017 Films of the right shoulder obtained in 3 projections. There is an elevation of the humeral head  in relationship to the glenoid there is approximately 6 mm between the humeral head and the acromion. There is lateral downsloping of the acromion and some degenerative changes at the acromioclavicular joint. No ectopic calcification. No obvious degenerative changes of the glenohumeral joint.. Compared to prior films there might be slightly more elevation of the humeral head    PMFS History: Patient  Active Problem List   Diagnosis Date Noted  . Alzheimer disease 04/16/2017  . Chronic left-sided low back pain with left-sided sciatica 11/04/2016  . Pain in left hip 11/04/2016  . Spondylolisthesis of lumbar region 11/04/2016  . Spondylosis without myelopathy or radiculopathy, lumbar region 11/04/2016  . Change in stool caliber 01/05/2015  . Encounter for screening colonoscopy 04/30/2012  . RLQ fullness 04/30/2012  . PALPITATIONS 02/23/2009  . VISUAL IMPAIRMENT 05/11/2008  . OSTEOARTHRITIS, KNEES, BILATERAL 02/18/2008  . ELEVATED BLOOD PRESSURE WITHOUT DIAGNOSIS OF HYPERTENSION 11/17/2007  . MEMORY LOSS 08/17/2007  . MRI, BRAIN, ABNORMAL 08/17/2007  . OSTEOPENIA 07/08/2007  . ANXIETY DEPRESSION 05/08/2007  . HYPERLIPIDEMIA 04/24/2007  . DECREASED HEARING 04/24/2007  . PULMONARY NODULE 04/24/2007   Past Medical History:  Diagnosis Date  . Alzheimer disease 04/16/2017  . Anxiety   . Depression   . Diverticulosis   . Dysrhythmia    palpitations  . Hyperlipidemia   . Hypertension   . Macular degeneration   . Memory loss    work up negative for Alzheimer's per medical records.  . Osteoarthritis   . Pulmonary nodules    per patient work-up completed at [facility] and no further evaluation needed    Family History  Problem Relation Age of Onset  . Diabetes Father   . Stroke Father   . Alzheimer's disease Mother     Past Surgical History:  Procedure Laterality Date  . BREAST ENHANCEMENT SURGERY    . CATARACT EXTRACTION  2009  . COLONOSCOPY WITH PROPOFOL N/A 09/17/2012   Dr.Rourk- normal rectum, pancolonic diverticulosis: o/w the remainder of the colonic mucosa appeared normal. it is notable the left colon was somewhat stiff and noncompliant.   . incomplete colonoscopy  2005   inadequate sedation, [facility]  . REFRACTIVE SURGERY  2010  . TONSILLECTOMY    . TOTAL KNEE ARTHROPLASTY  05/2009   right   Social History   Occupational History  . Occupation: Retired    Comment:  Patent attorney, Training and development officer  Tobacco Use  . Smoking status: Former Smoker    Packs/day: 0.30    Years: 2.00    Pack years: 0.60    Types: Cigarettes    Last attempt to quit: 09/14/1952    Years since quitting: 64.9  . Smokeless tobacco: Never Used  . Tobacco comment: quit in college  Substance and Sexual Activity  . Alcohol use: Yes    Alcohol/week: 0.6 oz    Types: 1 Glasses of wine per week    Comment: a glass of red wine every night  . Drug use: No  . Sexual activity: Yes    Birth control/protection: Post-menopausal

## 2017-08-28 ENCOUNTER — Telehealth (INDEPENDENT_AMBULATORY_CARE_PROVIDER_SITE_OTHER): Payer: Self-pay | Admitting: Orthopaedic Surgery

## 2017-08-28 NOTE — Telephone Encounter (Signed)
Please advise 

## 2017-08-28 NOTE — Telephone Encounter (Signed)
I called patient yesterday to schedule surgery for R shoulder, but before choosing a date both patient and her husband would like to speak to someone who has had the same surgery.  I suggested having the nurse or assistant to call and discuss post operative care, such as the expected recovery time  when normal activities could be resumed, and any complications that could arise after surgery.     Pt's # [id number].

## 2017-08-29 ENCOUNTER — Telehealth (HOSPITAL_COMMUNITY): Payer: Self-pay | Admitting: Internal Medicine

## 2017-08-29 NOTE — Telephone Encounter (Signed)
08/29/17  pt called and asked that all appts be cx she said that she thinks she will be having surgery

## 2017-09-01 ENCOUNTER — Encounter (HOSPITAL_COMMUNITY): Payer: Medicare HMO

## 2017-09-03 ENCOUNTER — Encounter (HOSPITAL_COMMUNITY): Payer: Medicare HMO

## 2017-09-03 ENCOUNTER — Ambulatory Visit (INDEPENDENT_AMBULATORY_CARE_PROVIDER_SITE_OTHER): Payer: Medicare HMO | Admitting: Orthopaedic Surgery

## 2017-09-03 ENCOUNTER — Encounter (INDEPENDENT_AMBULATORY_CARE_PROVIDER_SITE_OTHER): Payer: Self-pay | Admitting: Orthopaedic Surgery

## 2017-09-03 VITALS — BP 156/75 | HR 73 | Resp 14 | Ht 64.0 in | Wt 120.0 lb

## 2017-09-03 DIAGNOSIS — M545 Low back pain: Secondary | ICD-10-CM | POA: Diagnosis not present

## 2017-09-03 DIAGNOSIS — M25511 Pain in right shoulder: Secondary | ICD-10-CM

## 2017-09-03 DIAGNOSIS — G8929 Other chronic pain: Secondary | ICD-10-CM | POA: Diagnosis not present

## 2017-09-03 NOTE — Progress Notes (Signed)
Office Visit Note   Patient: Jennifer Cruz           Date of Birth: 1930/11/05           MRN: [id number] Visit Date: 09/03/2017              Requested by: Asencion Noble, MD [address] [place], [place] [address] PCP: Asencion Noble, MD   Assessment & Plan: Visit Diagnoses:  1. Chronic right shoulder pain   2. Chronic bilateral low back pain without sciatica     Plan: Has an established diagnosis of rotator cuff tear right shoulder and that to date has been unresponsive time injections and physical therapy. We had discussed surgical repair and her recent office visit. Mr. and Mrs. Clarida have decided they like to "weight". Again had long discussion regarding pluses and minuses of waiting versus giving this more time. Again have discussed the risks of operating versus nonoperating. In addition Mrs. Certain has had a recurrence of low back pain after a recent sneezing spell. Pain is localized to her lumbar spine without referred discomfort. Has had a prior MRI scan demonstrating diffuse degenerative changes. We'll ask Dr. Ernestina Patches to reevaluate and see her back as needed.office visit about 30 minutes discussing all of the above.  Follow-Up Instructions: Return if symptoms worsen or fail to improve.   Orders:  No orders of the defined types were placed in this encounter.  No orders of the defined types were placed in this encounter.     Procedures: No procedures performed   Clinical Data: No additional findings.   Subjective: Chief Complaint  Patient presents with  . Right Shoulder - Pain    [name] is an 84 y o here today for a F/U with a rotator cuff tear.  Pt wants to discuss her surgery.   . Lower Back - Pain    Pt having lumbar pain, MRI 11/2015, then ESI by Dr. Ernestina Patches. She relates her injection lasted until now.  Mr. and Mrs. Bunner would like to wait on consideration of surgery for her right shoulder. They had number of questions which I answered today in terms of  waiting versus not waiting and the potential issues with both. I think the very well informed. They'll just let us know if they like to proceed. She certainly does have some problems but seems to do well in terms of getting her arm overhead and sleeping. The other issue is recurrent back pain. She had a recent sneezing spell with recurrent back pain within the last several days. There is no referred pain to either lower extremity. He doesn't feel any neurologic deficit in either lower extremity. She did have an MRI scan in July 2017 demonstrating diffuse degenerative changes as had seen Dr. Ernestina Patches with good results with injections  HPI  Review of Systems  Constitutional: Negative for chills, fatigue and fever.  Eyes: Negative for itching.  Respiratory: Negative for chest tightness and shortness of breath.   Cardiovascular: Negative for chest pain, palpitations and leg swelling.  Gastrointestinal: Negative for blood in stool, constipation and diarrhea.  Endocrine: Negative for polyuria.  Genitourinary: Negative for dysuria.  Musculoskeletal: Positive for back pain. Negative for joint swelling, neck pain and neck stiffness.  Allergic/Immunologic: Negative for immunocompromised state.  Neurological: Negative for dizziness and numbness.  Hematological: Does not bruise/bleed easily.  Psychiatric/Behavioral: Positive for confusion. The patient is not nervous/anxious.      Objective: Vital Signs: BP (!) 156/75  Pulse 73   Resp 14   Ht 5\' 4"  (1.626 m)   Wt 120 lb (54.4 kg)   BMI 20.60 kg/m   Physical Exam  Ortho Exam no change in exam of right shoulder. She is able to raise her arm overhead in a circuitous motion. Has some weakness with external rotation. Skin intact. Neurovascular exam intact. No neck pain. No popping or clicking. Some pain along the anterior aspect of her shoulder but not at the acromioclavicular joint.  Straight leg raise is negative bilaterally. Neurologically appears  to be intact. Some percussible lumbar pain  Specialty Comments:  No specialty comments available.  Imaging: No results found.   PMFS History: Patient Active Problem List   Diagnosis Date Noted  . Alzheimer disease 04/16/2017  . Chronic left-sided low back pain with left-sided sciatica 11/04/2016  . Pain in left hip 11/04/2016  . Spondylolisthesis of lumbar region 11/04/2016  . Spondylosis without myelopathy or radiculopathy, lumbar region 11/04/2016  . Change in stool caliber 01/05/2015  . Encounter for screening colonoscopy 04/30/2012  . RLQ fullness 04/30/2012  . PALPITATIONS 02/23/2009  . VISUAL IMPAIRMENT 05/11/2008  . OSTEOARTHRITIS, KNEES, BILATERAL 02/18/2008  . ELEVATED BLOOD PRESSURE WITHOUT DIAGNOSIS OF HYPERTENSION 11/17/2007  . MEMORY LOSS 08/17/2007  . MRI, BRAIN, ABNORMAL 08/17/2007  . OSTEOPENIA 07/08/2007  . ANXIETY DEPRESSION 05/08/2007  . HYPERLIPIDEMIA 04/24/2007  . DECREASED HEARING 04/24/2007  . PULMONARY NODULE 04/24/2007   Past Medical History:  Diagnosis Date  . Alzheimer disease 04/16/2017  . Anxiety   . Depression   . Diverticulosis   . Dysrhythmia    palpitations  . Hyperlipidemia   . Hypertension   . Macular degeneration   . Memory loss    work up negative for Alzheimer's per medical records.  . Osteoarthritis   . Pulmonary nodules    per patient work-up completed at [facility] and no further evaluation needed    Family History  Problem Relation Age of Onset  . Diabetes Father   . Stroke Father   . Alzheimer's disease Mother     Past Surgical History:  Procedure Laterality Date  . BREAST ENHANCEMENT SURGERY    . CATARACT EXTRACTION  2009  . COLONOSCOPY WITH PROPOFOL N/A 09/17/2012   Dr.Rourk- normal rectum, pancolonic diverticulosis: o/w the remainder of the colonic mucosa appeared normal. it is notable the left colon was somewhat stiff and noncompliant.   . incomplete colonoscopy  2005   inadequate sedation, [facility]  . REFRACTIVE  SURGERY  2010  . TONSILLECTOMY    . TOTAL KNEE ARTHROPLASTY  05/2009   right   Social History   Occupational History  . Occupation: Retired    Comment: Patent attorney, Training and development officer  Tobacco Use  . Smoking status: Former Smoker    Packs/day: 0.30    Years: 2.00    Pack years: 0.60    Types: Cigarettes    Last attempt to quit: 09/14/1952    Years since quitting: 65.0  . Smokeless tobacco: Never Used  . Tobacco comment: quit in college  Substance and Sexual Activity  . Alcohol use: Yes    Alcohol/week: 0.6 oz    Types: 1 Glasses of wine per week    Comment: a glass of red wine every night  . Drug use: No  . Sexual activity: Yes    Birth control/protection: Post-menopausal

## 2017-09-03 NOTE — Telephone Encounter (Signed)
Discussed in clinic

## 2017-09-08 ENCOUNTER — Encounter (HOSPITAL_COMMUNITY): Payer: Medicare HMO

## 2017-09-10 ENCOUNTER — Encounter (HOSPITAL_COMMUNITY): Payer: Medicare HMO

## 2017-10-06 ENCOUNTER — Encounter (INDEPENDENT_AMBULATORY_CARE_PROVIDER_SITE_OTHER): Payer: Self-pay | Admitting: Physical Medicine and Rehabilitation

## 2017-10-06 ENCOUNTER — Ambulatory Visit (INDEPENDENT_AMBULATORY_CARE_PROVIDER_SITE_OTHER): Payer: Medicare HMO

## 2017-10-06 ENCOUNTER — Ambulatory Visit (INDEPENDENT_AMBULATORY_CARE_PROVIDER_SITE_OTHER): Payer: Medicare HMO | Admitting: Physical Medicine and Rehabilitation

## 2017-10-06 VITALS — BP 152/71 | HR 71 | Temp 97.7°F

## 2017-10-06 DIAGNOSIS — G8929 Other chronic pain: Secondary | ICD-10-CM | POA: Diagnosis not present

## 2017-10-06 DIAGNOSIS — M47816 Spondylosis without myelopathy or radiculopathy, lumbar region: Secondary | ICD-10-CM | POA: Diagnosis not present

## 2017-10-06 DIAGNOSIS — M545 Low back pain: Secondary | ICD-10-CM | POA: Diagnosis not present

## 2017-10-06 DIAGNOSIS — M25562 Pain in left knee: Secondary | ICD-10-CM | POA: Diagnosis not present

## 2017-10-06 MED ORDER — METHYLPREDNISOLONE ACETATE 80 MG/ML IJ SUSP
80.0000 mg | Freq: Once | INTRAMUSCULAR | Status: AC
  Administered 2017-10-06: 80 mg

## 2017-10-06 NOTE — Patient Instructions (Signed)

## 2017-10-06 NOTE — Progress Notes (Signed)
.  Numeric Pain Rating Scale and Functional Assessment Average Pain 6   In the last MONTH (on 0-10 scale) has pain interfered with the following?  1. General activity like being  able to carry out your everyday physical activities such as walking, climbing stairs, carrying groceries, or moving a chair?  Rating(5)   +Driver, -BT, -Dye Allergies.  

## 2017-10-06 NOTE — Progress Notes (Signed)
Jennifer Cruz - 84 y.o. female MRN [id number]  Date of birth: Nov 04, 1930  Office Visit Note: Visit Date: 10/06/2017 PCP: Asencion Noble, MD Referred by: Asencion Noble, MD  Subjective: Chief Complaint  Patient presents with  . Lower Back - Pain  . Left Knee - Pain   HPI: Jennifer Cruz is an 84 year old female who is followed by Dr. Durward Fortes for orthopedic care.  She comes in today at his request for evaluation of her low back and potential repeat of facet joint blocks which were done in May of last year.  She reports those injections helped quite a bit.  She reports onset of back pain more recently worse with standing and ambulating.  She reports no pain down the legs.  She said no specific trauma to her back.  She does report this morning when she was getting up she twisted her left knee.  She has not felt any instability or swelling.  She has felt like it has been tender.  She is reported no clicking or locking or popping.  She has a prior right total knee replacement.  She has been followed by Dr. Durward Fortes for chronic right shoulder pain.  She is also followed by Dr. Jannifer Franklin in neurology for late onset Alzheimer's dementia.  She has not noted any focal weakness or bowel or bladder changes.    Review of Systems  Constitutional: Negative for chills, fever, malaise/fatigue and weight loss.  HENT: Negative for hearing loss and sinus pain.   Eyes: Negative for blurred vision, double vision and photophobia.  Respiratory: Negative for cough and shortness of breath.   Cardiovascular: Negative for chest pain, palpitations and leg swelling.  Gastrointestinal: Negative for abdominal pain, nausea and vomiting.  Genitourinary: Negative for flank pain.  Musculoskeletal: Positive for back pain and joint pain. Negative for myalgias.  Skin: Negative for itching and rash.  Neurological: Negative for tremors, focal weakness and weakness.  Endo/Heme/Allergies: Negative.   Psychiatric/Behavioral: Negative for  depression. The patient is nervous/anxious.   All other systems reviewed and are negative.  Otherwise per HPI.  Assessment & Plan: Visit Diagnoses:  1. Spondylosis without myelopathy or radiculopathy, lumbar region   2. Chronic bilateral low back pain without sciatica   3. Acute pain of left knee     Plan: Findings:  1.  Chronic worsening axial low back pain worse with standing worse with extension consistent with severe facet arthropathy of the lumbar spine.  Prior injections seem to give her quite a bit of relief for several months.  We are going to repeat those today.  Given the amount of pain that she has even with just numbing the skin and maneuvering not sure she would be a great candidate for radiofrequency ablation unless we could find somebody to do that with sedation.  2.  In terms of her left knee her exam was fairly benign except for what I can only refer to has almost allodynia with pain to even just very light palpation of the skin both laterally and medially and anteriorly.  There was no real swelling.  No instability.  I told her to use ice and to just watch that over the next few weeks and if it seem like she had any of those signs or symptoms that we talked about to follow-up with Dr. Durward Fortes.    Meds & Orders:  Meds ordered this encounter  Medications  . methylPREDNISolone acetate (DEPO-MEDROL) injection 80 mg    Orders Placed This Encounter  Procedures  . Facet Injection  . XR C-ARM NO REPORT    Follow-up: Return if symptoms worsen or fail to improve, for Dr. Durward Fortes.   Procedures: No procedures performed  Lumbar Facet Joint Intra-Articular Injection(s) with Fluoroscopic Guidance  Patient: Jennifer Cruz      Date of Birth: [birth year] MRN: [id number] PCP: Asencion Noble, MD      Visit Date: 10/06/2017   Universal Protocol:    Date/Time: 10/06/2017  Consent Given By: the patient  Position: PRONE   Additional Comments: Vital signs were monitored before  and after the procedure. Patient was prepped and draped in the usual sterile fashion. The correct patient, procedure, and site was verified.   Injection Procedure Details:  Procedure Site One Meds Administered:  Meds ordered this encounter  Medications  . methylPREDNISolone acetate (DEPO-MEDROL) injection 80 mg     Laterality: Bilateral  Location/Site:  L4-L5 L5-S1  Needle size: 22 guage  Needle type: Spinal  Needle Placement: Articular  Findings:  -Comments: Excellent flow of contrast producing a partial arthrogram.  Procedure Details: The fluoroscope beam is vertically oriented in AP, and the inferior recess is visualized beneath the lower pole of the inferior apophyseal process, which represents the target point for needle insertion. When direct visualization is difficult the target point is located at the medial projection of the vertebral pedicle. The region overlying each aforementioned target is locally anesthetized with a 1 to 2 ml. volume of 1% Lidocaine without Epinephrine.   The spinal needle was inserted into each of the above mentioned facet joints using biplanar fluoroscopic guidance. A 0.25 to 0.5 ml. volume of Isovue-250 was injected and a partial facet joint arthrogram was obtained. A single spot film was obtained of the resulting arthrogram.    One to 1.25 ml of the steroid/anesthetic solution was then injected into each of the facet joints noted above.   Additional Comments:  No complications occurred.  Patient just seem to have increased sensitivity  with even numbing the skin with a 27-gauge needle.  At times she even made a weightbearing sound.  We are in direct contact with alcohol time I did ask that she needed to stop at any point and she said no that she want to continue.  After the injection was over even putting a Band-Aid on because of whimpering sensation. Dressing: Band-Aid    Post-procedure details: Patient was observed during the  procedure. Post-procedure instructions were reviewed.  Patient left the clinic in stable condition.    Clinical History: Lumbar spine MRI dated 02/07/2016 There is significant grade 1 listhesis of L5 on S1 with surrounding endplate edema and possible compression of the endplate. There is no observable pars defect on the left and questionable on the right. She has facet arthropathy at L4-5 but without central canal narrowing that is severe at either level. 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 She also has very large Tarlov cyst greater on the right than left at S2.   She reports that she quit smoking about 65 years ago. Her smoking use included cigarettes. She has a 0.60 pack-year smoking history. she has never used smokeless tobacco. No results for input(s): HGBA1C, LABURIC in the last 8760 hours.  Objective:  VS:  HT:    WT:   BMI:     BP:(!) 152/71  HR:71bpm  TEMP:97.7 F (36.5 C)(Oral)  RESP:100 % Physical Exam  Constitutional: She is oriented to person, place, and  time. She appears well-developed and well-nourished.  Eyes: Conjunctivae and EOM are normal. Pupils are equal, round, and reactive to light.  Cardiovascular: Normal rate and intact distal pulses.  Pulmonary/Chest: Effort normal.  Musculoskeletal:  Patient ambulates without aid with a fairly normal gait.  She does have pain with extension of the lumbar spine.  She has good distal strength.  Examination of the left knee does not show any frank swelling.  She technically has joint line tenderness but it somewhat some allodynia with even light palpation of the skin anteriorly lateral and medial.  She does not have any pain on the right.  I was doing the exam and just touching the skin even going to touch the skin on her hand caused her to be jumpy.  Neurological: She is alert and oriented to person, place, and time. She exhibits normal muscle tone.  Skin: Skin is warm  and dry. No rash noted. No erythema.  Psychiatric: She has a normal mood and affect. Her behavior is normal.  Nursing note and vitals reviewed.   Ortho Exam Imaging: Xr C-arm No Report  Result Date: 10/06/2017 Please see Notes or Procedures tab for imaging impression.   Past Medical/Family/Surgical/Social History: Medications & Allergies reviewed per EMR, new medications updated. Patient Active Problem List   Diagnosis Date Noted  . Alzheimer disease 04/16/2017  . Chronic left-sided low back pain with left-sided sciatica 11/04/2016  . Pain in left hip 11/04/2016  . Spondylolisthesis of lumbar region 11/04/2016  . Spondylosis without myelopathy or radiculopathy, lumbar region 11/04/2016  . Change in stool caliber 01/05/2015  . Encounter for screening colonoscopy 04/30/2012  . RLQ fullness 04/30/2012  . PALPITATIONS 02/23/2009  . VISUAL IMPAIRMENT 05/11/2008  . OSTEOARTHRITIS, KNEES, BILATERAL 02/18/2008  . ELEVATED BLOOD PRESSURE WITHOUT DIAGNOSIS OF HYPERTENSION 11/17/2007  . MEMORY LOSS 08/17/2007  . MRI, BRAIN, ABNORMAL 08/17/2007  . OSTEOPENIA 07/08/2007  . ANXIETY DEPRESSION 05/08/2007  . HYPERLIPIDEMIA 04/24/2007  . DECREASED HEARING 04/24/2007  . PULMONARY NODULE 04/24/2007   Past Medical History:  Diagnosis Date  . Alzheimer disease 04/16/2017  . Anxiety   . Depression   . Diverticulosis   . Dysrhythmia    palpitations  . Hyperlipidemia   . Hypertension   . Macular degeneration   . Memory loss    work up negative for Alzheimer's per medical records.  . Osteoarthritis   . Pulmonary nodules    per patient work-up completed at [facility] and no further evaluation needed   Family History  Problem Relation Age of Onset  . Diabetes Father   . Stroke Father   . Alzheimer's disease Mother    Past Surgical History:  Procedure Laterality Date  . BREAST ENHANCEMENT SURGERY    . CATARACT EXTRACTION  2009  . COLONOSCOPY WITH PROPOFOL N/A 09/17/2012   Dr.Rourk- normal  rectum, pancolonic diverticulosis: o/w the remainder of the colonic mucosa appeared normal. it is notable the left colon was somewhat stiff and noncompliant.   . incomplete colonoscopy  2005   inadequate sedation, [facility]  . REFRACTIVE SURGERY  2010  . TONSILLECTOMY    . TOTAL KNEE ARTHROPLASTY  05/2009   right   Social History   Occupational History  . Occupation: Retired    Comment: Patent attorney, Training and development officer  Tobacco Use  . Smoking status: Former Smoker    Packs/day: 0.30    Years: 2.00    Pack years: 0.60    Types: Cigarettes    Last attempt to quit: 09/14/1952  Years since quitting: 65.1  . Smokeless tobacco: Never Used  . Tobacco comment: quit in college  Substance and Sexual Activity  . Alcohol use: Yes    Alcohol/week: 0.6 oz    Types: 1 Glasses of wine per week    Comment: a glass of red wine every night  . Drug use: No  . Sexual activity: Yes    Birth control/protection: Post-menopausal

## 2017-10-06 NOTE — Procedures (Signed)
Lumbar Facet Joint Intra-Articular Injection(s) with Fluoroscopic Guidance  Patient: Jennifer Cruz      Date of Birth: [birth year] MRN: [id number] PCP: Asencion Noble, MD      Visit Date: 10/06/2017   Universal Protocol:    Date/Time: 10/06/2017  Consent Given By: the patient  Position: PRONE   Additional Comments: Vital signs were monitored before and after the procedure. Patient was prepped and draped in the usual sterile fashion. The correct patient, procedure, and site was verified.   Injection Procedure Details:  Procedure Site One Meds Administered:  Meds ordered this encounter  Medications  . methylPREDNISolone acetate (DEPO-MEDROL) injection 80 mg     Laterality: Bilateral  Location/Site:  L4-L5 L5-S1  Needle size: 22 guage  Needle type: Spinal  Needle Placement: Articular  Findings:  -Comments: Excellent flow of contrast producing a partial arthrogram.  Procedure Details: The fluoroscope beam is vertically oriented in AP, and the inferior recess is visualized beneath the lower pole of the inferior apophyseal process, which represents the target point for needle insertion. When direct visualization is difficult the target point is located at the medial projection of the vertebral pedicle. The region overlying each aforementioned target is locally anesthetized with a 1 to 2 ml. volume of 1% Lidocaine without Epinephrine.   The spinal needle was inserted into each of the above mentioned facet joints using biplanar fluoroscopic guidance. A 0.25 to 0.5 ml. volume of Isovue-250 was injected and a partial facet joint arthrogram was obtained. A single spot film was obtained of the resulting arthrogram.    One to 1.25 ml of the steroid/anesthetic solution was then injected into each of the facet joints noted above.   Additional Comments:  No complications occurred.  Patient just seem to have increased sensitivity  with even numbing the skin with a 27-gauge needle.  At  times she even made a weightbearing sound.  We are in direct contact with alcohol time I did ask that she needed to stop at any point and she said no that she want to continue.  After the injection was over even putting a Band-Aid on because of whimpering sensation. Dressing: Band-Aid    Post-procedure details: Patient was observed during the procedure. Post-procedure instructions were reviewed.  Patient left the clinic in stable condition.

## 2017-10-14 ENCOUNTER — Encounter: Payer: Self-pay | Admitting: Neurology

## 2017-10-14 ENCOUNTER — Ambulatory Visit: Payer: Medicare HMO | Admitting: Neurology

## 2017-10-14 ENCOUNTER — Other Ambulatory Visit: Payer: Self-pay

## 2017-10-14 VITALS — BP 185/73 | HR 66 | Ht 64.0 in | Wt 125.5 lb

## 2017-10-14 DIAGNOSIS — G301 Alzheimer's disease with late onset: Secondary | ICD-10-CM

## 2017-10-14 DIAGNOSIS — F0281 Dementia in other diseases classified elsewhere with behavioral disturbance: Secondary | ICD-10-CM | POA: Diagnosis not present

## 2017-10-14 MED ORDER — ESCITALOPRAM OXALATE 5 MG PO TABS: 5.0000 mg | ORAL_TABLET | Freq: Every day | ORAL | 3 refills | Status: DC

## 2017-10-14 NOTE — Patient Instructions (Signed)
   We will start Lexapro 5 mg for anxiety and frustration.

## 2017-10-14 NOTE — Progress Notes (Signed)
Reason for visit: Memory disturbance  Jennifer Cruz is an 84 y.o. female  History of present illness:  Jennifer Cruz is an 84 year old right-handed white female with a history of a progressive memory disturbance felt secondary to Alzheimer's disease.  The patient is on Namzeric and she is tolerating medication well.  Her husband believes that her short-term memory has gradually worsened some over time.  The patient is still operating a motor vehicle safely, she does not have any problems with directions.  The patient uses a calendar to help keep up with appointments.  She manages her own medications.  The patient sleeps well at night, she is still cooking and has no problems with this.  She has lost some interest in many of the clubs that she had been involved with.  She still remains somewhat active.  She does report some low back pain.  The husband indicates that there is a lot of frustration with her memory issues.  The patient returns for an evaluation.  She has not given up doing anything because of memory since last seen.  Past Medical History:  Diagnosis Date  . Alzheimer disease 04/16/2017  . Anxiety   . Depression   . Diverticulosis   . Dysrhythmia    palpitations  . Hyperlipidemia   . Hypertension   . Macular degeneration   . Memory loss    work up negative for Alzheimer's per medical records.  . Osteoarthritis   . Pulmonary nodules    per patient work-up completed at [facility] and no further evaluation needed    Past Surgical History:  Procedure Laterality Date  . BREAST ENHANCEMENT SURGERY    . CATARACT EXTRACTION  2009  . COLONOSCOPY WITH PROPOFOL N/A 09/17/2012   Dr.Rourk- normal rectum, pancolonic diverticulosis: o/w the remainder of the colonic mucosa appeared normal. it is notable the left colon was somewhat stiff and noncompliant.   . incomplete colonoscopy  2005   inadequate sedation, [facility]  . REFRACTIVE SURGERY  2010  . TONSILLECTOMY    . TOTAL KNEE ARTHROPLASTY   05/2009   right    Family History  Problem Relation Age of Onset  . Diabetes Father   . Stroke Father   . Alzheimer's disease Mother     Social history:  reports that she quit smoking about 65 years ago. Her smoking use included cigarettes. She has a 0.60 pack-year smoking history. she has never used smokeless tobacco. She reports that she drinks about 0.6 oz of alcohol per week. She reports that she does not use drugs.    Allergies  Allergen Reactions  . Celecoxib Hives  . Amitriptyline Hcl     REACTION: Palpatations  . Prochlorperazine Edisylate     REACTION: Used for nausea and developed tardive dyskinesia    Medications:  Prior to Admission medications   Medication Sig Start Date End Date Taking? Authorizing Provider  B Complex Vitamins (B COMPLEX PO) daily. 05/28/16  Yes [provider]  CALCIUM CITRATE-VITAMIN D PO daily. 05/28/16  Yes [provider]  fish oil-omega-3 fatty acids 1000 MG capsule Take 2 g by mouth daily.   Yes [provider]  ibuprofen (ADVIL,MOTRIN) 100 MG tablet Take 100 mg by mouth as needed for fever. Takes 1-2 times daily   Yes [provider]  Memantine HCl-Donepezil HCl (NAMZARIC) 28-10 MG CP24 Take 1 capsule by mouth daily. 02/24/17  Yes Kathrynn Ducking, MD  Multiple Vitamins-Minerals (MULTIVITAMIN WITH MINERALS) tablet Take 1 tablet  by mouth daily.   Yes [provider]  Vitamins A & D 5000-400 units CAPS  05/28/16  Yes [provider]    ROS:  Out of a complete 14 system review of symptoms, the patient complains only of the following symptoms, and all other reviewed systems are negative.  Cold intolerance Joint pain, back pain Memory loss Agitation  Blood pressure (!) 185/73, pulse 66, height 5\' 4"  (1.626 m), weight 125 lb 8 oz (56.9 kg).  Physical Exam  General: The patient is alert and cooperative at the time of the examination.  Skin: No significant peripheral edema is  noted.   Neurologic Exam  Mental status: The patient is alert and oriented x 3 at the time of the examination. The patient has apparent normal recent and remote memory, with an apparently normal attention span and concentration ability.  The Mini-Mental status examination done today shows a total score of 30/30.   Cranial nerves: Facial symmetry is present. Speech is normal, no aphasia or dysarthria is noted. Extraocular movements are full. Visual fields are full.  Motor: The patient has good strength in all 4 extremities.  Sensory examination: Soft touch sensation is symmetric on the face, arms, and legs.  Coordination: The patient has good finger-nose-finger and heel-to-shin bilaterally.  Gait and station: The patient has a normal gait. Tandem gait is slightly unsteady. Romberg is negative. No drift is seen.  Reflexes: Deep tendon reflexes are symmetric.   Assessment/Plan:  1.  Memory disturbance  The patient is having some frustration with her memory problems, we will add Lexapro in low-dose taking 5 mg daily.  The patient is interested in learning more about research, I will try to get the research coordinator to contact her.  She will follow-up in 6 months.   Jill Alexanders MD 10/14/2017 10:18 AM  [facility] [address] [place], [place] [address]  Phone [phone] Fax [phone]

## 2017-10-14 NOTE — Progress Notes (Signed)
Called and spoke with Joelene Millin at [facility], cx rx lexapro 5mg  tablet sent there. Advised pt wanted rx to go to different pharmacy. She verbalized understanding and cx.

## 2017-10-15 DIAGNOSIS — H401222 Low-tension glaucoma, left eye, moderate stage: Secondary | ICD-10-CM | POA: Diagnosis not present

## 2017-12-02 ENCOUNTER — Telehealth (INDEPENDENT_AMBULATORY_CARE_PROVIDER_SITE_OTHER): Payer: Self-pay | Admitting: Physical Medicine and Rehabilitation

## 2017-12-02 NOTE — Telephone Encounter (Signed)
OV

## 2017-12-02 NOTE — Telephone Encounter (Signed)
Left message for patient to call back to schedule an OV.

## 2017-12-04 NOTE — Telephone Encounter (Signed)
Scheduled for 5/28 at [place].

## 2017-12-24 ENCOUNTER — Ambulatory Visit (INDEPENDENT_AMBULATORY_CARE_PROVIDER_SITE_OTHER): Payer: Medicare HMO | Admitting: Physical Medicine and Rehabilitation

## 2017-12-24 ENCOUNTER — Encounter (INDEPENDENT_AMBULATORY_CARE_PROVIDER_SITE_OTHER): Payer: Self-pay | Admitting: Physical Medicine and Rehabilitation

## 2017-12-24 VITALS — BP 148/74 | HR 66 | Temp 97.6°F

## 2017-12-24 DIAGNOSIS — M4820 Kissing spine, site unspecified: Secondary | ICD-10-CM | POA: Diagnosis not present

## 2017-12-24 DIAGNOSIS — M4316 Spondylolisthesis, lumbar region: Secondary | ICD-10-CM

## 2017-12-24 DIAGNOSIS — M545 Low back pain: Secondary | ICD-10-CM

## 2017-12-24 DIAGNOSIS — G8929 Other chronic pain: Secondary | ICD-10-CM | POA: Diagnosis not present

## 2017-12-24 DIAGNOSIS — M47816 Spondylosis without myelopathy or radiculopathy, lumbar region: Secondary | ICD-10-CM | POA: Diagnosis not present

## 2017-12-24 NOTE — Progress Notes (Signed)
 .  Numeric Pain Rating Scale and Functional Assessment Average Pain 7 Pain Right Now 5 My pain is constant, dull and aching Pain is worse with: walking, bending, sitting and some activites Pain improves with: heat/ice   In the last MONTH (on 0-10 scale) has pain interfered with the following?  1. General activity like being  able to carry out your everyday physical activities such as walking, climbing stairs, carrying groceries, or moving a chair?  Rating(5)  2. Relation with others like being able to carry out your usual social activities and roles such as  activities at home, at work and in your community. Rating(4)  3. Enjoyment of life such that you have  been bothered by emotional problems such as feeling anxious, depressed or irritable?  Rating(2)

## 2017-12-31 ENCOUNTER — Encounter (INDEPENDENT_AMBULATORY_CARE_PROVIDER_SITE_OTHER): Payer: Medicare HMO | Admitting: Physical Medicine and Rehabilitation

## 2018-01-02 ENCOUNTER — Encounter (INDEPENDENT_AMBULATORY_CARE_PROVIDER_SITE_OTHER): Payer: Self-pay | Admitting: Physical Medicine and Rehabilitation

## 2018-01-02 ENCOUNTER — Ambulatory Visit (INDEPENDENT_AMBULATORY_CARE_PROVIDER_SITE_OTHER): Payer: Medicare HMO | Admitting: Physical Medicine and Rehabilitation

## 2018-01-02 ENCOUNTER — Ambulatory Visit (INDEPENDENT_AMBULATORY_CARE_PROVIDER_SITE_OTHER): Payer: Medicare HMO

## 2018-01-02 DIAGNOSIS — M47816 Spondylosis without myelopathy or radiculopathy, lumbar region: Secondary | ICD-10-CM | POA: Diagnosis not present

## 2018-01-02 DIAGNOSIS — G8929 Other chronic pain: Secondary | ICD-10-CM | POA: Diagnosis not present

## 2018-01-02 DIAGNOSIS — M545 Low back pain: Secondary | ICD-10-CM | POA: Diagnosis not present

## 2018-01-02 NOTE — Progress Notes (Signed)
Jennifer Cruz - 84 y.o. female MRN [id number]  Date of birth: 1931/05/04  Office Visit Note: Visit Date: 01/02/2018 PCP: Jennifer Noble, MD Referred by: Jennifer Noble, MD  Subjective: Chief Complaint  Patient presents with  . Lower Back - Pain   HPI: Jennifer Cruz is an 84 year old female that comes in with her husband who provides a lot of the history.  She does have early Alzheimer's disease.  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  Her husband once again has several questions written down that we did take the time to go over with him completely.  We will see over the next week to 2 weeks how she does with the steroid injection around the spinous processes.  She can still maintain her current level of activity without any changes.   ROS Otherwise per HPI.  Assessment & Plan: Visit Diagnoses:  1. Chronic midline low back pain without sciatica     Plan: No additional findings.   Meds & Orders: No orders of the defined types were placed in this encounter.   Orders Placed This Encounter  Procedures  . Large Joint Inj: R hip joint  . XR C-ARM NO REPORT    Follow-up: Return if symptoms worsen or fail to improve.   Procedures: Large Joint Inj (L4-5 spinous process.) on 01/02/2018 11:08 AM Indications: diagnostic evaluation and pain Details: 22 G 3.5 in needle, fluoroscopy-guided posterior approach  Arthrogram: No  Medications: 3 mL bupivacaine 0.5 %; 80 mg triamcinolone acetonide 40 MG/ML Outcome: tolerated well, no immediate complications  Fluoroscopic imaging was utilized to identify the L4-L5 spinous processes.  A slightly oblique view to the right was obtained and biplanar imaging was utilized to place a 22-gauge spinal needle between the 2 spinous processes and in the pseudo-joint.  Injection was delivered freely with small amount of contrast to  confirm placement without vascular flow. Procedure, treatment alternatives, risks and benefits explained, specific risks discussed. Consent was given by the patient. Immediately prior to procedure a time out was called to verify the correct patient, procedure, equipment, support staff and site/side marked as required. Patient was prepped and draped in the usual sterile fashion.      No notes on file   Clinical History: MRI LUMBAR SPINE WITHOUT CONTRAST  TECHNIQUE: Multiplanar, multisequence MR imaging of the lumbar spine was performed. No intravenous contrast was administered.  COMPARISON: None.  FINDINGS: Segmentation: The lowest lumbar type non-rib-bearing vertebra is labeled as L5.  Alignment: 8 mm degenerative anterolisthesis at L5-S1. 3 mm degenerative retrolisthesis at L2-3.  Vertebrae: There is some bony subsidence along the inferior endplate of L5 with low-grade surrounding edema, probably the result of and endplate compression. However, there is also some upward scalloping of the sacrum with slight bony interdigitation at L5-S1, and low-grade edema in the left S1 segment near the disc. Some of this may be attributable to degenerative endplate findings. There is no pars defect on the left side and only a questionable right pars defect at L5.  Type 2 degenerative endplate findings at L2- 3 with loss of disc height and mild endplate irregularity. Type 2 degenerative endplate findings at T55- L1, again with mild endplate irregularity and loss of disc height. Disc desiccation throughout the lumbar spine.  At the S2 vertebral level there is a bilobed Tarlov cyst, right-sided component measuring 2.9 by 3.3 cm  and left-sided component measuring 2.4 by 2.1 cm. Scalloping of the S2 vertebra results.  Small amount of edema signal between the spinous processes at L4-5.  Conus medullaris: Extends to the L1 level and appears normal.  Paraspinal and other soft tissues: Sigmoid  colon diverticulosis is present.  Disc levels:  T12-L1: No impingement. Disc bulge and facet arthropathy.  L1- 2: No impingement. Diffuse disc bulge.  L2-3: Mild displacement of the left L2 nerve in the lateral extraforaminal space and borderline left subarticular lateral recess stenosis due to disc bulge and intervertebral spurring.  L3-4: No impingement. Diffuse disc bulge.  L4-5: Borderline central narrowing of the thecal sac and borderline right foraminal stenosis due to disc bulge and facet arthropathy. Small right lateral recess disc protrusion.  L5-S1: Prominent left and moderate to prominent right foraminal stenosis with mild bilateral subarticular lateral recess stenosis and mild central narrowing of the thecal sac due to disc uncovering, disc bulge, subluxation, and facet arthropathy.  IMPRESSION: 1. Lumbar spondylosis and degenerative disc disease, causing prominent impingement at L5-S1 and mild impingement at L2- 3, as detailed above. 2. The 8 mm of anterolisthesis at L5-S1 is primarily degenerative but I can't completely exclude a subtle right pars defect. 3. There is some endplate subsidence at W0-J8, possibly from mild endplate compressions in the past. 4. Large Tarlov cysts bilaterally at the S2 level. 5. 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   Electronically Signed By: Jennifer Cruz M.D. On: 02/07/2016 12:53   She reports that she quit smoking about 65 years ago. Her smoking use included cigarettes. She has a 0.60 pack-year smoking history. She has never used smokeless tobacco. No results for input(s): HGBA1C, LABURIC in the last 8760 hours.  Objective:  VS:  HT:    WT:   BMI:     BP:   HR: bpm  TEMP: ( )  RESP:  Physical Exam  Ortho Exam Imaging: No results found.  Past Medical/Family/Surgical/Social History: Medications & Allergies reviewed per EMR, new medications updated. Patient Active Problem List   Diagnosis Date Noted  . Alzheimer disease  04/16/2017  . Chronic left-sided low back pain with left-sided sciatica 11/04/2016  . Pain in left hip 11/04/2016  . Spondylolisthesis of lumbar region 11/04/2016  . Spondylosis without myelopathy or radiculopathy, lumbar region 11/04/2016  . Change in stool caliber 01/05/2015  . Encounter for screening colonoscopy 04/30/2012  . RLQ fullness 04/30/2012  . PALPITATIONS 02/23/2009  . VISUAL IMPAIRMENT 05/11/2008  . OSTEOARTHRITIS, KNEES, BILATERAL 02/18/2008  . ELEVATED BLOOD PRESSURE WITHOUT DIAGNOSIS OF HYPERTENSION 11/17/2007  . MEMORY LOSS 08/17/2007  . MRI, BRAIN, ABNORMAL 08/17/2007  . OSTEOPENIA 07/08/2007  . ANXIETY DEPRESSION 05/08/2007  . HYPERLIPIDEMIA 04/24/2007  . DECREASED HEARING 04/24/2007  . PULMONARY NODULE 04/24/2007   Past Medical History:  Diagnosis Date  . Alzheimer disease 04/16/2017  . Anxiety   . Depression   . Diverticulosis   . Dysrhythmia    palpitations  . Hyperlipidemia   . Hypertension   . Macular degeneration   . Memory loss    work up negative for Alzheimer's per medical records.  . Osteoarthritis   . Pulmonary nodules    per patient work-up completed at [facility] and no further evaluation needed   Family History  Problem Relation Age of Onset  . Diabetes Father   . Stroke Father   . Alzheimer's disease Mother    Past Surgical History:  Procedure Laterality Date  . BREAST ENHANCEMENT SURGERY    . CATARACT EXTRACTION  2009  . COLONOSCOPY WITH PROPOFOL N/A 09/17/2012   Dr.Rourk- normal rectum, pancolonic diverticulosis: o/w the remainder of the colonic mucosa appeared normal. it is notable the left colon was somewhat stiff and noncompliant.   . incomplete colonoscopy  2005   inadequate sedation, [facility]  . REFRACTIVE SURGERY  2010  . TONSILLECTOMY    . TOTAL KNEE ARTHROPLASTY  05/2009   right   Social History   Occupational History  . Occupation: Retired    Comment: Patent attorney, Training and development officer  Tobacco Use  . Smoking status: Former Smoker     Packs/day: 0.30    Years: 2.00    Pack years: 0.60    Types: Cigarettes    Last attempt to quit: 09/14/1952    Years since quitting: 65.3  . Smokeless tobacco: Never Used  . Tobacco comment: quit in college  Substance and Sexual Activity  . Alcohol use: Yes    Alcohol/week: 0.6 oz    Types: 1 Glasses of wine per week    Comment: a glass of red wine every night  . Drug use: No  . Sexual activity: Yes    Birth control/protection: Post-menopausal

## 2018-01-02 NOTE — Patient Instructions (Signed)

## 2018-01-02 NOTE — Progress Notes (Signed)
  Numeric Pain Rating Scale and Functional Assessment Average Pain 8   In the last MONTH (on 0-10 scale) has pain interfered with the following?  1. General activity like being  able to carry out your everyday physical activities such as walking, climbing stairs, carrying groceries, or moving a chair?  Rating(2)   +Driver, -BT, -Dye Allergies. 

## 2018-01-05 NOTE — Progress Notes (Signed)
Jennifer Cruz - 84 y.o. female MRN [id number]  Date of birth: Apr 28, 1931  Office Visit Note: Visit Date: 12/24/2017 PCP: Asencion Noble, MD Referred by: Asencion Noble, MD  Subjective: Chief Complaint  Patient presents with  . Lower Back - Pain   HPI: Jennifer Cruz is a pleasant 84 year old female who is accompanied by her husband who provides a lot of the history.  Her case is complicated by memory difficulties and Alzheimer's disease.  We last saw her in March and completed bilateral facet joint blocks at L4-5 and L5-S1.  She did not tolerate the procedure very well even placing Band-Aids on the scan and any movement on the table was really problematic.  We did complete the injection however and did show good fluoroscopic imaging of the injection around the facet joints.  She reports may be 50% relief but the pai did not completely go away.n we have spoken with the patient and her husband on numerous occasions about her spine and the fact that she has a pretty degenerative spine with facet arthritis and significant spondylolisthesis or slippage.  She also has a bilobed Tarlov cyst at S2 without nerve compression.  There is also some level of Bastrop syndrome at L4-5 with inflammatory appearing changes and ligamentum hypertrophy.  She was reluctant to return because of the problem she had just dealing with the injection itself.  She does come in today with her husband providing most of the history with axial low back pain which is really midline at the low back area centered along the spinous processes without any referral pattern down the legs.  She has no focal weakness or pins-and-needles.  No paresthesias.  She reports any movement lifting and bending seems to hurt.  She hurts when she bends forward and then lifting up and a pretty classic facet mediated type pattern.  She reports hot baths seem to ease her pain.  She does not tolerate medications very well.  Her average pain is a 7 out of 10.  She likes  to work in the garden and she does continue to do this.  She has had no unexplained weight loss or fevers chills or night sweats.   Review of Systems  Constitutional: Negative for chills, fever, malaise/fatigue and weight loss.  HENT: Negative for hearing loss and sinus pain.   Eyes: Negative for blurred vision, double vision and photophobia.  Respiratory: Negative for cough and shortness of breath.   Cardiovascular: Negative for chest pain, palpitations and leg swelling.  Gastrointestinal: Negative for abdominal pain, nausea and vomiting.  Genitourinary: Negative for flank pain.  Musculoskeletal: Positive for back pain and joint pain. Negative for myalgias.  Skin: Negative for itching and rash.  Neurological: Negative for tremors, focal weakness and weakness.  Endo/Heme/Allergies: Negative.   Psychiatric/Behavioral: Positive for memory loss. Negative for depression.  All other systems reviewed and are negative.  Otherwise per HPI.  Assessment & Plan: Visit Diagnoses:  1. Spondylosis without myelopathy or radiculopathy, lumbar region   2. Chronic bilateral low back pain without sciatica   3. Spondylolisthesis of lumbar region   4. Baastrup's syndrome     Plan: Findings:  Chronic worsening severe midline lower back pain which I feel may be related to the Bastrop's syndrome or kissing spinous processes at L4-5.  Granted the MRI imaging is not recent enough to say that this is an acute finding but it was clearly there in 2017.  She clearly has multilevel facet arthropathy which I think  is part of the problem as well and she did get 50% relief with the prior injection although she did not like the injection very much at all.  She does not have much of the right of radicular complaints and fortunately does not have much in the way of central canal stenosis although she does have some foraminal stenosis.  We spoke at length for greater than 40 minutes answering questions from her and her husband.   I think the next approach would be localized injection between the spinous processes at L4-5 with fluoroscopic guidance.  This would be diagnostic and hopefully therapeutic.  She could be a candidate for radiofrequency ablation of the facet joints however she did not tolerate the prior facet joint blocks which are much easier than the ablation.  She also has a hard time understanding that the pain was not completely relieved with the injection which is actually very common and I explained this to them.  Her current medications are adequate and we talked about activity modification and continued working in the yard at length.  I discussed with her mostly arthritic changes of her spine that she really can do what she can tolerate in terms of her spine that she will not injure herself any more than anybody else.  We discussed no heavy lifting which she does not do anyway.  Greater than 50% of this visit (total duration of visit was 40 minutes) was spent in counseling and coordination of care discussing above.    Meds & Orders: No orders of the defined types were placed in this encounter.  No orders of the defined types were placed in this encounter.   Follow-up: Return for Planned injection of the L4-5 spinous process..   Procedures: No procedures performed  No notes on file   Clinical History: MRI LUMBAR SPINE WITHOUT CONTRAST  TECHNIQUE: Multiplanar, multisequence MR imaging of the lumbar spine was performed. No intravenous contrast was administered.  COMPARISON: None.  FINDINGS: Segmentation: The lowest lumbar type non-rib-bearing vertebra is labeled as L5.  Alignment: 8 mm degenerative anterolisthesis at L5-S1. 3 mm degenerative retrolisthesis at L2-3.  Vertebrae: There is some bony subsidence along the inferior endplate of L5 with low-grade surrounding edema, probably the result of and endplate compression. However, there is also some upward scalloping of the sacrum with slight bony  interdigitation at L5-S1, and low-grade edema in the left S1 segment near the disc. Some of this may be attributable to degenerative endplate findings. There is no pars defect on the left side and only a questionable right pars defect at L5.  Type 2 degenerative endplate findings at L2- 3 with loss of disc height and mild endplate irregularity. Type 2 degenerative endplate findings at E95- L1, again with mild endplate irregularity and loss of disc height. Disc desiccation throughout the lumbar spine.  At the S2 vertebral level there is a bilobed Tarlov cyst, right-sided component measuring 2.9 by 3.3 cm and left-sided component measuring 2.4 by 2.1 cm. Scalloping of the S2 vertebra results.  Small amount of edema signal between the spinous processes at L4-5.  Conus medullaris: Extends to the L1 level and appears normal.  Paraspinal and other soft tissues: Sigmoid colon diverticulosis is present.  Disc levels:  T12-L1: No impingement. Disc bulge and facet arthropathy.  L1- 2: No impingement. Diffuse disc bulge.  L2-3: Mild displacement of the left L2 nerve in the lateral extraforaminal space and borderline left subarticular lateral recess stenosis due to disc bulge and intervertebral  spurring.  L3-4: No impingement. Diffuse disc bulge.  L4-5: Borderline central narrowing of the thecal sac and borderline right foraminal stenosis due to disc bulge and facet arthropathy. Small right lateral recess disc protrusion.  L5-S1: Prominent left and moderate to prominent right foraminal stenosis with mild bilateral subarticular lateral recess stenosis and mild central narrowing of the thecal sac due to disc uncovering, disc bulge, subluxation, and facet arthropathy.  IMPRESSION: 1. Lumbar spondylosis and degenerative disc disease, causing prominent impingement at L5-S1 and mild impingement at L2- 3, as detailed above. 2. The 8 mm of anterolisthesis at L5-S1 is primarily  degenerative but I can't completely exclude a subtle right pars defect. 3. There is some endplate subsidence at K0-U5, possibly from mild endplate compressions in the past. 4. Large Tarlov cysts bilaterally at the S2 level. 5. 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   Electronically Signed By: Van Clines M.D. On: 02/07/2016 12:53   She reports that she quit smoking about 65 years ago. Her smoking use included cigarettes. She has a 0.60 pack-year smoking history. She has never used smokeless tobacco. No results for input(s): HGBA1C, LABURIC in the last 8760 hours.  Objective:  VS:  HT:    WT:   BMI:     BP:(!) 148/74  HR:66bpm  TEMP:97.6 F (36.4 C)(Oral)  RESP:100 % Physical Exam  Constitutional: She is oriented to person, place, and time. She appears well-developed and well-nourished. No distress.  HENT:  Head: Normocephalic and atraumatic.  Nose: Nose normal.  Mouth/Throat: Oropharynx is clear and moist.  Eyes: Pupils are equal, round, and reactive to light. Conjunctivae are normal.  Neck: Normal range of motion. Neck supple.  Cardiovascular: Regular rhythm and intact distal pulses.  Pulmonary/Chest: Effort normal. No respiratory distress.  Abdominal: She exhibits no distension. There is no guarding.  Musculoskeletal:  Patient has pain on extension of the lumbar spine.  She has no pain with hip rotation.  No pain over the greater trochanters.  She has good strength in the lower limbs without clonus.  She has exquisite pain with even light palpation over the scan of the vertebral bodies of the L4 and L5 region.  Clearly some level of allodynia.  Neurological: She is alert and oriented to person, place, and time. She exhibits normal muscle tone. Coordination normal.  Skin: Skin is warm. No rash noted. No erythema.  Psychiatric: Her behavior is normal.  Somewhat flat affect and somewhat irritated with level of pain and result from prior injection.  Nursing note and vitals reviewed.    Ortho Exam Imaging: No results found.  Past Medical/Family/Surgical/Social History: Medications & Allergies reviewed per EMR, new medications updated. Patient Active Problem List   Diagnosis Date Noted  . Alzheimer disease 04/16/2017  . Chronic left-sided low back pain with left-sided sciatica 11/04/2016  . Pain in left hip 11/04/2016  . Spondylolisthesis of lumbar region 11/04/2016  . Spondylosis without myelopathy or radiculopathy, lumbar region 11/04/2016  . Change in stool caliber 01/05/2015  . Encounter for screening colonoscopy 04/30/2012  . RLQ fullness 04/30/2012  . PALPITATIONS 02/23/2009  . VISUAL IMPAIRMENT 05/11/2008  . OSTEOARTHRITIS, KNEES, BILATERAL 02/18/2008  . ELEVATED BLOOD PRESSURE WITHOUT DIAGNOSIS OF HYPERTENSION 11/17/2007  . MEMORY LOSS 08/17/2007  . MRI, BRAIN, ABNORMAL 08/17/2007  . OSTEOPENIA 07/08/2007  . ANXIETY DEPRESSION 05/08/2007  . HYPERLIPIDEMIA 04/24/2007  . DECREASED HEARING 04/24/2007  . PULMONARY NODULE 04/24/2007   Past Medical History:  Diagnosis Date  . Alzheimer disease 04/16/2017  . Anxiety   .  Depression   . Diverticulosis   . Dysrhythmia    palpitations  . Hyperlipidemia   . Hypertension   . Macular degeneration   . Memory loss    work up negative for Alzheimer's per medical records.  . Osteoarthritis   . Pulmonary nodules    per patient work-up completed at [facility] and no further evaluation needed   Family History  Problem Relation Age of Onset  . Diabetes Father   . Stroke Father   . Alzheimer's disease Mother    Past Surgical History:  Procedure Laterality Date  . BREAST ENHANCEMENT SURGERY    . CATARACT EXTRACTION  2009  . COLONOSCOPY WITH PROPOFOL N/A 09/17/2012   Dr.Rourk- normal rectum, pancolonic diverticulosis: o/w the remainder of the colonic mucosa appeared normal. it is notable the left colon was somewhat stiff and noncompliant.   . incomplete colonoscopy  2005   inadequate sedation, [facility]  . REFRACTIVE  SURGERY  2010  . TONSILLECTOMY    . TOTAL KNEE ARTHROPLASTY  05/2009   right   Social History   Occupational History  . Occupation: Retired    Comment: Patent attorney, Training and development officer  Tobacco Use  . Smoking status: Former Smoker    Packs/day: 0.30    Years: 2.00    Pack years: 0.60    Types: Cigarettes    Last attempt to quit: 09/14/1952    Years since quitting: 65.3  . Smokeless tobacco: Never Used  . Tobacco comment: quit in college  Substance and Sexual Activity  . Alcohol use: Yes    Alcohol/week: 0.6 oz    Types: 1 Glasses of wine per week    Comment: a glass of red wine every night  . Drug use: No  . Sexual activity: Yes    Birth control/protection: Post-menopausal

## 2018-01-06 MED ORDER — TRIAMCINOLONE ACETONIDE 40 MG/ML IJ SUSP
80.0000 mg | INTRAMUSCULAR | Status: AC | PRN
  Administered 2018-01-02: 80 mg via INTRA_ARTICULAR

## 2018-01-06 MED ORDER — BUPIVACAINE HCL 0.5 % IJ SOLN
3.0000 mL | INTRAMUSCULAR | Status: AC | PRN
  Administered 2018-01-02: 3 mL via INTRA_ARTICULAR

## 2018-01-22 DIAGNOSIS — L570 Actinic keratosis: Secondary | ICD-10-CM | POA: Diagnosis not present

## 2018-01-22 DIAGNOSIS — X32XXXD Exposure to sunlight, subsequent encounter: Secondary | ICD-10-CM | POA: Diagnosis not present

## 2018-01-28 ENCOUNTER — Ambulatory Visit (INDEPENDENT_AMBULATORY_CARE_PROVIDER_SITE_OTHER): Payer: Medicare HMO | Admitting: Orthopaedic Surgery

## 2018-02-04 ENCOUNTER — Ambulatory Visit (INDEPENDENT_AMBULATORY_CARE_PROVIDER_SITE_OTHER): Payer: Medicare HMO | Admitting: Orthopaedic Surgery

## 2018-02-19 DIAGNOSIS — H353112 Nonexudative age-related macular degeneration, right eye, intermediate dry stage: Secondary | ICD-10-CM | POA: Diagnosis not present

## 2018-02-19 DIAGNOSIS — H02052 Trichiasis without entropian right lower eyelid: Secondary | ICD-10-CM | POA: Diagnosis not present

## 2018-02-23 DIAGNOSIS — G3184 Mild cognitive impairment, so stated: Secondary | ICD-10-CM | POA: Diagnosis not present

## 2018-02-23 DIAGNOSIS — Z6820 Body mass index (BMI) 20.0-20.9, adult: Secondary | ICD-10-CM | POA: Diagnosis not present

## 2018-03-03 ENCOUNTER — Telehealth: Payer: Self-pay | Admitting: Neurology

## 2018-03-03 NOTE — Telephone Encounter (Signed)
Dr. Jannifer Franklin- [facility]

## 2018-03-03 NOTE — Telephone Encounter (Signed)
I checked epic and most recent DPR scanned in on 10/10/16 only has husband, not daughter. Unable to call her back.

## 2018-03-03 NOTE — Telephone Encounter (Addendum)
Patient's daughter [name] (on [place]) calling to discuss patients condition before her appointment on 04-20-18 with Dr. Jannifer Franklin.

## 2018-03-17 ENCOUNTER — Telehealth (INDEPENDENT_AMBULATORY_CARE_PROVIDER_SITE_OTHER): Payer: Self-pay | Admitting: Physical Medicine and Rehabilitation

## 2018-03-17 NOTE — Telephone Encounter (Signed)
Try L5-S1 or L4-5 interlam schedule for 30 mintues, tell them we will talk and decide if injection needed. Get pre-auth in case

## 2018-03-18 ENCOUNTER — Telehealth: Payer: Self-pay | Admitting: Neurology

## 2018-03-18 MED ORDER — MEMANTINE HCL-DONEPEZIL HCL ER 28-10 MG PO CP24: 1.0000 | ORAL_CAPSULE | Freq: Every day | ORAL | 2 refills | Status: AC

## 2018-03-18 NOTE — Telephone Encounter (Signed)
E-scribed refills to pt pharmacy as requested.

## 2018-03-18 NOTE — Telephone Encounter (Signed)
Needs auth for [id number]. Will need 30 minute appt for eval and ESI.

## 2018-03-18 NOTE — Telephone Encounter (Signed)
Pt has called for a refill on her Memantine HCl-Donepezil HCl (NAMZARIC) 28-10 MG CP24 Please send to  [id number] - [place], [address] AT [address]. Rut[address] Mannan [phone] (Phone) [phone] (Fax)

## 2018-03-18 NOTE — Addendum Note (Signed)
Addended by: Hope Pigeon on: 03/18/2018 12:26 PM   Modules accepted: Orders

## 2018-03-18 NOTE — Telephone Encounter (Signed)
Called pt and left vm #1.   Auth No / Request ID [id number] Status Auto-Approved Decision Approved Effective Date 03/23/2018 Expiration Date 05/07/2018

## 2018-03-23 DIAGNOSIS — R413 Other amnesia: Secondary | ICD-10-CM | POA: Diagnosis not present

## 2018-03-25 NOTE — Telephone Encounter (Signed)
Called pt and she states that she will call back to schedule appt

## 2018-04-06 NOTE — Telephone Encounter (Signed)
Scheduled for 04/17/18 at 1345.

## 2018-04-07 ENCOUNTER — Telehealth (INDEPENDENT_AMBULATORY_CARE_PROVIDER_SITE_OTHER): Payer: Self-pay | Admitting: Orthopaedic Surgery

## 2018-04-07 NOTE — Telephone Encounter (Signed)
PLEASE ADVISE.

## 2018-04-07 NOTE — Telephone Encounter (Signed)
Patient's daughter states that she is constantly hurting and is taking about 10 Advil a day.  It does not matter how much she is walking around or working in the yard, she is still hurting a lot.  She wanted to let you know because of the Dementia and will not tell you everything.  The daughter states that she can do very little and still be in a lot of pain. She has had 5 injections from Dr. Ernestina Patches and those are not helping either.  She is wanting to know if there is some type of surgical procedure that can be done to help with the pain.  If her husband says anything about what is going on with her, he will pay for it when they get back home.  She will become very agitated and aggressive with him.  [phone]

## 2018-04-07 NOTE — Telephone Encounter (Signed)
Plan to see in [place] tomorrow

## 2018-04-08 ENCOUNTER — Other Ambulatory Visit (INDEPENDENT_AMBULATORY_CARE_PROVIDER_SITE_OTHER): Payer: Self-pay | Admitting: Radiology

## 2018-04-08 ENCOUNTER — Ambulatory Visit (INDEPENDENT_AMBULATORY_CARE_PROVIDER_SITE_OTHER): Payer: Medicare HMO

## 2018-04-08 ENCOUNTER — Encounter (INDEPENDENT_AMBULATORY_CARE_PROVIDER_SITE_OTHER): Payer: Self-pay | Admitting: Orthopaedic Surgery

## 2018-04-08 ENCOUNTER — Ambulatory Visit (INDEPENDENT_AMBULATORY_CARE_PROVIDER_SITE_OTHER): Payer: Medicare HMO | Admitting: Orthopaedic Surgery

## 2018-04-08 VITALS — BP 125/72 | HR 77 | Ht 65.0 in | Wt 123.0 lb

## 2018-04-08 DIAGNOSIS — G8929 Other chronic pain: Secondary | ICD-10-CM

## 2018-04-08 DIAGNOSIS — M545 Low back pain: Secondary | ICD-10-CM | POA: Diagnosis not present

## 2018-04-08 DIAGNOSIS — M25562 Pain in left knee: Secondary | ICD-10-CM

## 2018-04-08 MED ORDER — METHYLPREDNISOLONE ACETATE 40 MG/ML IJ SUSP
80.0000 mg | INTRAMUSCULAR | Status: AC | PRN
  Administered 2018-04-08: 80 mg

## 2018-04-08 MED ORDER — LIDOCAINE HCL 1 % IJ SOLN
2.0000 mL | INTRAMUSCULAR | Status: AC | PRN
  Administered 2018-04-08: 2 mL

## 2018-04-08 MED ORDER — BUPIVACAINE HCL 0.5 % IJ SOLN
2.0000 mL | INTRAMUSCULAR | Status: AC | PRN
  Administered 2018-04-08: 2 mL via INTRA_ARTICULAR

## 2018-04-08 NOTE — Progress Notes (Signed)
Office Visit Note   Patient: Jennifer Cruz           Date of Birth: 05-05-1931           MRN: [id number] Visit Date: 04/08/2018              Requested by: Asencion Noble, MD [address] [place], [place] [address] PCP: Asencion Noble, MD   Assessment & Plan: Visit Diagnoses:  1. Chronic pain of left knee   2. Chronic bilateral low back pain without sciatica     Plan: MRI L-S spine, cortisone inj left knee. Office visit over 30 min 50% in counseling re dx and rx options. Hold on further spine inj until after MRI  Follow-Up Instructions: Return after MRI L-S spine.   Orders:  Orders Placed This Encounter  Procedures  . XR KNEE 3 VIEW LEFT  . XR Lumbar Spine 2-3 Views   No orders of the defined types were placed in this encounter.     Procedures: Large Joint Inj: L knee on 04/08/2018 2:34 PM Indications: pain and diagnostic evaluation Details: 25 G 1.5 in needle, anteromedial approach  Arthrogram: No  Medications: 2 mL lidocaine 1 %; 2 mL bupivacaine 0.5 %; 80 mg methylPREDNISolone acetate 40 MG/ML Procedure, treatment alternatives, risks and benefits explained, specific risks discussed. Consent was given by the patient. Patient was prepped and draped in the usual sterile fashion.       Clinical Data: No additional findings.   Subjective: Chief Complaint  Patient presents with  . Follow-up    SEEN DR NEWTON 01/02/18 HAD 3RD INJECTION BUT DID NOT HELP. PT ALSO HAVING L KNEE AND BI LAT HIP PAIN  Has had 3 inj in L-S spine with Dr Ernestina Patches. Stiil having considerable pain without radicular symptoms-working in yard, sleeping, Taking advil. Also pain in left knee with bending,stooping.Prior right TKR with good results  HPI  Review of Systems  Constitutional: Negative for fatigue and fever.  HENT: Negative for ear pain.   Eyes: Negative for pain.  Respiratory: Negative for cough and shortness of breath.   Cardiovascular: Negative for leg swelling.    Gastrointestinal: Negative for constipation and diarrhea.  Genitourinary: Negative for difficulty urinating.  Musculoskeletal: Positive for back pain. Negative for neck pain.  Skin: Negative for rash.  Allergic/Immunologic: Positive for food allergies.  Neurological: Positive for weakness. Negative for numbness.  Hematological: Bruises/bleeds easily.  Psychiatric/Behavioral: Positive for sleep disturbance.     Objective: Vital Signs: BP 125/72 (BP Location: Left Arm, Patient Position: Sitting, Cuff Size: Normal)   Pulse 77   Ht 5\' 5"  (1.651 m)   Wt 123 lb (55.8 kg)   BMI 20.47 kg/m   Physical Exam  Constitutional: She is oriented to person, place, and time. She appears well-developed and well-nourished.  HENT:  Mouth/Throat: Oropharynx is clear and moist.  Eyes: Pupils are equal, round, and reactive to light. EOM are normal.  Pulmonary/Chest: Effort normal.  Neurological: She is alert and oriented to person, place, and time.  Skin: Skin is warm and dry.  Psychiatric: She has a normal mood and affect. Her behavior is normal.    Ortho ExamSLR neg. Percussible tenderness to L-S spine. Motors intact. Left knee with hypertrophic changes and pos effusion. No instability. Lacks a few degrees to full extension,flexes to 100 degrees  Specialty Comments:  No specialty comments available.  Imaging: No results found.   PMFS History: Patient Active Problem List   Diagnosis Date Noted  .  Alzheimer disease 04/16/2017  . Chronic left-sided low back pain with left-sided sciatica 11/04/2016  . Pain in left hip 11/04/2016  . Spondylolisthesis of lumbar region 11/04/2016  . Spondylosis without myelopathy or radiculopathy, lumbar region 11/04/2016  . Change in stool caliber 01/05/2015  . Encounter for screening colonoscopy 04/30/2012  . RLQ fullness 04/30/2012  . PALPITATIONS 02/23/2009  . VISUAL IMPAIRMENT 05/11/2008  . OSTEOARTHRITIS, KNEES, BILATERAL 02/18/2008  . ELEVATED BLOOD  PRESSURE WITHOUT DIAGNOSIS OF HYPERTENSION 11/17/2007  . MEMORY LOSS 08/17/2007  . MRI, BRAIN, ABNORMAL 08/17/2007  . OSTEOPENIA 07/08/2007  . ANXIETY DEPRESSION 05/08/2007  . HYPERLIPIDEMIA 04/24/2007  . DECREASED HEARING 04/24/2007  . PULMONARY NODULE 04/24/2007   Past Medical History:  Diagnosis Date  . Alzheimer disease 04/16/2017  . Anxiety   . Depression   . Diverticulosis   . Dysrhythmia    palpitations  . Hyperlipidemia   . Hypertension   . Macular degeneration   . Memory loss    work up negative for Alzheimer's per medical records.  . Osteoarthritis   . Pulmonary nodules    per patient work-up completed at [facility] and no further evaluation needed    Family History  Problem Relation Age of Onset  . Diabetes Father   . Stroke Father   . Alzheimer's disease Mother     Past Surgical History:  Procedure Laterality Date  . BREAST ENHANCEMENT SURGERY    . CATARACT EXTRACTION  2009  . COLONOSCOPY WITH PROPOFOL N/A 09/17/2012   Dr.Rourk- normal rectum, pancolonic diverticulosis: o/w the remainder of the colonic mucosa appeared normal. it is notable the left colon was somewhat stiff and noncompliant.   . incomplete colonoscopy  2005   inadequate sedation, [facility]  . REFRACTIVE SURGERY  2010  . TONSILLECTOMY    . TOTAL KNEE ARTHROPLASTY  05/2009   right   Social History   Occupational History  . Occupation: Retired    Comment: Patent attorney, Training and development officer  Tobacco Use  . Smoking status: Former Smoker    Packs/day: 0.30    Years: 2.00    Pack years: 0.60    Types: Cigarettes    Last attempt to quit: 09/14/1952    Years since quitting: 65.6  . Smokeless tobacco: Never Used  . Tobacco comment: quit in college  Substance and Sexual Activity  . Alcohol use: Yes    Alcohol/week: 1.0 standard drinks    Types: 1 Glasses of wine per week    Comment: a glass of red wine every night  . Drug use: No  . Sexual activity: Yes    Birth control/protection: Post-menopausal

## 2018-04-17 ENCOUNTER — Encounter (INDEPENDENT_AMBULATORY_CARE_PROVIDER_SITE_OTHER): Payer: Medicare HMO | Admitting: Physical Medicine and Rehabilitation

## 2018-04-17 ENCOUNTER — Ambulatory Visit
Admission: RE | Admit: 2018-04-17 | Discharge: 2018-04-17 | Disposition: A | Discharge status: Home / Self Care | Payer: Medicare HMO | Source: Ambulatory Visit | Attending: Orthopaedic Surgery | Admitting: Orthopaedic Surgery

## 2018-04-17 DIAGNOSIS — M545 Low back pain: Principal | ICD-10-CM

## 2018-04-17 DIAGNOSIS — G8929 Other chronic pain: Secondary | ICD-10-CM

## 2018-04-17 DIAGNOSIS — M48061 Spinal stenosis, lumbar region without neurogenic claudication: Secondary | ICD-10-CM | POA: Diagnosis not present

## 2018-04-20 ENCOUNTER — Encounter: Payer: Self-pay | Admitting: Neurology

## 2018-04-20 ENCOUNTER — Ambulatory Visit: Payer: Medicare HMO | Admitting: Neurology

## 2018-04-20 ENCOUNTER — Encounter

## 2018-04-20 VITALS — BP 158/78 | HR 66 | Wt 125.0 lb

## 2018-04-20 DIAGNOSIS — G301 Alzheimer's disease with late onset: Secondary | ICD-10-CM | POA: Diagnosis not present

## 2018-04-20 DIAGNOSIS — F0281 Dementia in other diseases classified elsewhere with behavioral disturbance: Secondary | ICD-10-CM

## 2018-04-20 MED ORDER — SERTRALINE HCL 25 MG PO TABS: 25.0000 mg | ORAL_TABLET | Freq: Every day | ORAL | 2 refills | Status: DC

## 2018-04-20 NOTE — Progress Notes (Signed)
Reason for visit: Memory disturbance  Jennifer Cruz is an 84 y.o. female  History of present illness:  Jennifer Cruz is an 84 year old right-handed white female with a history of a mild memory disturbance.  The patient is on Namzeric, she has tolerated this fairly well, she sleeps fairly well at night.  She is not exercising as frequently as she used to.  Her husband is concerned that she has a high degree of anxiety and frustration with her memory problems, she gets upset easily.  The patient was placed on Lexapro when last seen, the patient stopped the medication after she got lost while driving shortly after she started the medication.  The patient has not been on anything for anxiety or depression.  She returns to the office today for an evaluation.  Past Medical History:  Diagnosis Date  . Alzheimer disease 04/16/2017  . Anxiety   . Depression   . Diverticulosis   . Dysrhythmia    palpitations  . Hyperlipidemia   . Hypertension   . Macular degeneration   . Memory loss    work up negative for Alzheimer's per medical records.  . Osteoarthritis   . Pulmonary nodules    per patient work-up completed at [facility] and no further evaluation needed    Past Surgical History:  Procedure Laterality Date  . BREAST ENHANCEMENT SURGERY    . CATARACT EXTRACTION  2009  . COLONOSCOPY WITH PROPOFOL N/A 09/17/2012   Dr.Rourk- normal rectum, pancolonic diverticulosis: o/w the remainder of the colonic mucosa appeared normal. it is notable the left colon was somewhat stiff and noncompliant.   . incomplete colonoscopy  2005   inadequate sedation, [facility]  . REFRACTIVE SURGERY  2010  . TONSILLECTOMY    . TOTAL KNEE ARTHROPLASTY  05/2009   right    Family History  Problem Relation Age of Onset  . Diabetes Father   . Stroke Father   . Alzheimer's disease Mother     Social history:  reports that she quit smoking about 65 years ago. Her smoking use included cigarettes. She has a 0.60 pack-year  smoking history. She has never used smokeless tobacco. She reports that she drinks about 1.0 standard drinks of alcohol per week. She reports that she does not use drugs.    Allergies  Allergen Reactions  . Celecoxib Hives  . Amitriptyline Hcl     REACTION: Palpatations  . Prochlorperazine Edisylate     REACTION: Used for nausea and developed tardive dyskinesia    Medications:  Prior to Admission medications   Medication Sig Start Date End Date Taking? Authorizing Provider  B Complex Vitamins (B COMPLEX PO) daily. 05/28/16   [provider]  CALCIUM CITRATE-VITAMIN D PO daily. 05/28/16   [provider]  escitalopram (LEXAPRO) 5 MG tablet Take 1 tablet (5 mg total) by mouth daily. 10/14/17   Kathrynn Ducking, MD  fish oil-omega-3 fatty acids 1000 MG capsule Take 2 g by mouth daily.    [provider]  ibuprofen (ADVIL,MOTRIN) 100 MG tablet Take 100 mg by mouth as needed for fever. Takes 1-2 times daily    [provider]  Memantine HCl-Donepezil HCl (NAMZARIC) 28-10 MG CP24 Take 1 capsule by mouth daily. 03/18/18   Kathrynn Ducking, MD  Multiple Vitamins-Minerals (MULTIVITAMIN WITH MINERALS) tablet Take 1 tablet by mouth daily.    [provider]  Omega-3 Fatty Acids (FISH OIL) 1000 MG CAPS Take by mouth.    [provider]  Vitamins A & D 5000-400 units CAPS  05/28/16   [provider]    ROS:  Out of a complete 14 system review of symptoms, the patient complains only of the following symptoms, and all other reviewed systems are negative.  Back pain Lactose intolerance Back pain  Blood pressure (!) 158/78, pulse 66, weight 125 lb (56.7 kg), SpO2 98 %.  Physical Exam  General: The patient is alert and cooperative at the time of the examination.  Skin: No significant peripheral edema is noted.   Neurologic Exam  Mental status: The patient is alert and oriented x 3 at the time of the examination. The patient has  apparent normal recent and remote memory, with an apparently normal attention span and concentration ability.  Mini-Mental status examination done today shows a total score 29/30.   Cranial nerves: Facial symmetry is present. Speech is normal, no aphasia or dysarthria is noted. Extraocular movements are full. Visual fields are full.  Motor: The patient has good strength in all 4 extremities.  Sensory examination: Soft touch sensation is symmetric on the face, arms, and legs.  Coordination: The patient has good finger-nose-finger and heel-to-shin bilaterally.  Gait and station: The patient has a normal gait. Tandem gait is normal. Romberg is negative. No drift is seen.  Reflexes: Deep tendon reflexes are symmetric.   Assessment/Plan:  1.  Mild memory disturbance  The patient has been progressing with her memory very slowly.  She does seem to have a lot of troubles with frustration with her memory issues, we will restart a medication such as Zoloft taking 25 mg a day, they will call for any dose adjustments.  She will remain on Namzeric.  She will follow-up in 6 months.  The patient will get back into a regular exercise program.  Jill Alexanders MD 04/20/2018 10:33 AM  [facility] [address] [place], [place] [address]  Phone [phone] Fax [phone]

## 2018-04-22 ENCOUNTER — Encounter (INDEPENDENT_AMBULATORY_CARE_PROVIDER_SITE_OTHER): Payer: Self-pay | Admitting: Orthopaedic Surgery

## 2018-04-22 ENCOUNTER — Other Ambulatory Visit (INDEPENDENT_AMBULATORY_CARE_PROVIDER_SITE_OTHER): Payer: Self-pay | Admitting: Radiology

## 2018-04-22 ENCOUNTER — Ambulatory Visit (INDEPENDENT_AMBULATORY_CARE_PROVIDER_SITE_OTHER): Payer: Medicare HMO | Admitting: Orthopaedic Surgery

## 2018-04-22 VITALS — BP 166/92 | HR 82 | Ht 65.0 in | Wt 122.0 lb

## 2018-04-22 DIAGNOSIS — G8929 Other chronic pain: Secondary | ICD-10-CM | POA: Diagnosis not present

## 2018-04-22 DIAGNOSIS — M545 Low back pain, unspecified: Secondary | ICD-10-CM | POA: Insufficient documentation

## 2018-04-22 MED ORDER — METHOCARBAMOL 500 MG PO TABS: ORAL_TABLET | ORAL | 0 refills | Status: DC

## 2018-04-22 NOTE — Progress Notes (Signed)
Office Visit Note   Patient: Jennifer Cruz           Date of Birth: Jul 15, 1931           MRN: [id number] Visit Date: 04/22/2018              Requested by: Asencion Noble, MD [address] [place], [place] [address] PCP: Asencion Noble, MD   Assessment & Plan: Visit Diagnoses:  1. Chronic bilateral low back pain without sciatica     Plan: Repeat MRI scan was performed and compared to the 2017 scan.  There is a grade 2 anterolisthesis of L5 on S1 secondary to bilateral L5 pars interarticularis defects.  There is severe bilateral facet arthropathy and severe bilateral foraminal stenosis at the same level with mild spinal stenosis.  Long discussion over 30 minutes with Mr. and Mrs. Kiddy re guarding the diagnosis and treatment options.  Cortisone injections have not been helpful in the past.  She does try some Advil which seems to help.  She has a back support which I would encourage her to use.  She has a series of back exercise sizes that she should continue.  I will also give her a prescription for Robaxin 250 mg she can take twice a day.  I check with Dr. Willey Blade who felt that that would not be a problem.  Follow-Up Instructions: Return if symptoms worsen or fail to improve.   Orders:  No orders of the defined types were placed in this encounter.  No orders of the defined types were placed in this encounter.     Procedures: No procedures performed   Clinical Data: No additional findings.   Subjective: No chief complaint on file. Kahl returns for reevaluation of her low back pain.  She is very active around the house during the day and has trouble particularly at the end of the day or at night with back pain.  She is had some mild buttock and hip discomfort but no significant radiculopathy further distally in either lower extremity.  She had an MRI scan in 2017 that demonstrated some abnormalities at L5-S1 with this and foraminal stenosis.  She is had at least 4 cortisone  injections without much relief.  I repeated the MRI scan with the results as above.  HPI  Review of Systems  Constitutional: Negative for fatigue and fever.  HENT: Negative for ear pain.   Eyes: Negative for pain.  Respiratory: Negative for cough and shortness of breath.   Cardiovascular: Negative for leg swelling.  Gastrointestinal: Negative for constipation and diarrhea.  Genitourinary: Negative for difficulty urinating.  Musculoskeletal: Positive for back pain. Negative for neck pain.  Skin: Negative for rash.  Allergic/Immunologic: Positive for food allergies.  Neurological: Positive for weakness. Negative for numbness.  Hematological: Bruises/bleeds easily.  Psychiatric/Behavioral: Positive for sleep disturbance.     Objective: Vital Signs: BP (!) 166/92 (BP Location: Right Arm, Patient Position: Sitting, Cuff Size: Normal)   Pulse 82   Ht 5\' 5"  (1.651 m)   Wt 122 lb (55.3 kg)   BMI 20.30 kg/m   Physical Exam  Constitutional: She is oriented to person, place, and time. She appears well-developed and well-nourished.  HENT:  Mouth/Throat: Oropharynx is clear and moist.  Eyes: Pupils are equal, round, and reactive to light. EOM are normal.  Pulmonary/Chest: Effort normal.  Neurological: She is alert and oriented to person, place, and time.  Skin: Skin is warm and dry.  Psychiatric: She has a  normal mood and affect. Her behavior is normal.    Ortho Exam awake alert and oriented x3.  Comfortable sitting.  Straight leg raise negative.  Painless range of motion both hips both knees.  Motor exam intact.  Good sensibility to feet.  Some mild percussible tenderness to lower lumbar spine.  Specialty Comments:  No specialty comments available.  Imaging: No results found.   PMFS History: Patient Active Problem List   Diagnosis Date Noted  . Chronic bilateral low back pain without sciatica 04/22/2018  . Alzheimer disease 04/16/2017  . Chronic left-sided low back pain with  left-sided sciatica 11/04/2016  . Pain in left hip 11/04/2016  . Spondylolisthesis of lumbar region 11/04/2016  . Spondylosis without myelopathy or radiculopathy, lumbar region 11/04/2016  . Change in stool caliber 01/05/2015  . Encounter for screening colonoscopy 04/30/2012  . RLQ fullness 04/30/2012  . PALPITATIONS 02/23/2009  . VISUAL IMPAIRMENT 05/11/2008  . OSTEOARTHRITIS, KNEES, BILATERAL 02/18/2008  . ELEVATED BLOOD PRESSURE WITHOUT DIAGNOSIS OF HYPERTENSION 11/17/2007  . MEMORY LOSS 08/17/2007  . MRI, BRAIN, ABNORMAL 08/17/2007  . OSTEOPENIA 07/08/2007  . ANXIETY DEPRESSION 05/08/2007  . HYPERLIPIDEMIA 04/24/2007  . DECREASED HEARING 04/24/2007  . PULMONARY NODULE 04/24/2007   Past Medical History:  Diagnosis Date  . Alzheimer disease 04/16/2017  . Anxiety   . Depression   . Diverticulosis   . Dysrhythmia    palpitations  . Hyperlipidemia   . Hypertension   . Macular degeneration   . Memory loss    work up negative for Alzheimer's per medical records.  . Osteoarthritis   . Pulmonary nodules    per patient work-up completed at [facility] and no further evaluation needed    Family History  Problem Relation Age of Onset  . Diabetes Father   . Stroke Father   . Alzheimer's disease Mother     Past Surgical History:  Procedure Laterality Date  . BREAST ENHANCEMENT SURGERY    . CATARACT EXTRACTION  2009  . COLONOSCOPY WITH PROPOFOL N/A 09/17/2012   Dr.Rourk- normal rectum, pancolonic diverticulosis: o/w the remainder of the colonic mucosa appeared normal. it is notable the left colon was somewhat stiff and noncompliant.   . incomplete colonoscopy  2005   inadequate sedation, [facility]  . REFRACTIVE SURGERY  2010  . TONSILLECTOMY    . TOTAL KNEE ARTHROPLASTY  05/2009   right   Social History   Occupational History  . Occupation: Retired    Comment: Patent attorney, Training and development officer  Tobacco Use  . Smoking status: Former Smoker    Packs/day: 0.30    Years: 2.00    Pack years:  0.60    Types: Cigarettes    Last attempt to quit: 09/14/1952    Years since quitting: 65.6  . Smokeless tobacco: Never Used  . Tobacco comment: quit in college  Substance and Sexual Activity  . Alcohol use: Yes    Alcohol/week: 1.0 standard drinks    Types: 1 Glasses of wine per week    Comment: a glass of red wine every night  . Drug use: No  . Sexual activity: Yes    Birth control/protection: Post-menopausal

## 2018-05-20 ENCOUNTER — Other Ambulatory Visit: Payer: Self-pay

## 2018-05-20 ENCOUNTER — Encounter (HOSPITAL_COMMUNITY): Payer: Self-pay | Admitting: Emergency Medicine

## 2018-05-20 ENCOUNTER — Emergency Department (HOSPITAL_COMMUNITY): Payer: Medicare HMO

## 2018-05-20 ENCOUNTER — Emergency Department (HOSPITAL_COMMUNITY)
Admission: EM | Admit: 2018-05-20 | Discharge: 2018-05-20 | Disposition: A | Discharge status: Home / Self Care | Payer: Medicare HMO | Attending: Emergency Medicine | Admitting: Emergency Medicine

## 2018-05-20 DIAGNOSIS — R42 Dizziness and giddiness: Secondary | ICD-10-CM | POA: Diagnosis not present

## 2018-05-20 DIAGNOSIS — Z79899 Other long term (current) drug therapy: Secondary | ICD-10-CM | POA: Insufficient documentation

## 2018-05-20 DIAGNOSIS — I10 Essential (primary) hypertension: Secondary | ICD-10-CM | POA: Diagnosis not present

## 2018-05-20 DIAGNOSIS — G309 Alzheimer's disease, unspecified: Secondary | ICD-10-CM | POA: Insufficient documentation

## 2018-05-20 DIAGNOSIS — Z7982 Long term (current) use of aspirin: Secondary | ICD-10-CM | POA: Diagnosis not present

## 2018-05-20 DIAGNOSIS — Z87891 Personal history of nicotine dependence: Secondary | ICD-10-CM | POA: Diagnosis not present

## 2018-05-20 LAB — COMPREHENSIVE METABOLIC PANEL
ALT: 18 U/L (ref 0–44)
ANION GAP: 8 (ref 5–15)
AST: 19 U/L (ref 15–41)
Albumin: 3.7 g/dL (ref 3.5–5.0)
Alkaline Phosphatase: 57 U/L (ref 38–126)
BILIRUBIN TOTAL: 0.9 mg/dL (ref 0.3–1.2)
BUN: 17 mg/dL (ref 8–23)
CHLORIDE: 106 mmol/L (ref 98–111)
CO2: 24 mmol/L (ref 22–32)
Calcium: 8.8 mg/dL — ABNORMAL LOW (ref 8.9–10.3)
Creatinine, Ser: 0.63 mg/dL (ref 0.44–1.00)
Glucose, Bld: 131 mg/dL — ABNORMAL HIGH (ref 70–99)
POTASSIUM: 3.7 mmol/L (ref 3.5–5.1)
Sodium: 138 mmol/L (ref 135–145)
TOTAL PROTEIN: 6.5 g/dL (ref 6.5–8.1)

## 2018-05-20 LAB — CBC
HCT: 45.7 % (ref 36.0–46.0)
Hemoglobin: 14.5 g/dL (ref 12.0–15.0)
MCH: 29.4 pg (ref 26.0–34.0)
MCHC: 31.7 g/dL (ref 30.0–36.0)
MCV: 92.5 fL (ref 80.0–100.0)
NRBC: 0 % (ref 0.0–0.2)
Platelets: 217 10*3/uL (ref 150–400)
RBC: 4.94 MIL/uL (ref 3.87–5.11)
RDW: 13.1 % (ref 11.5–15.5)
WBC: 6.8 10*3/uL (ref 4.0–10.5)

## 2018-05-20 LAB — DIFFERENTIAL
ABS IMMATURE GRANULOCYTES: 0.02 10*3/uL (ref 0.00–0.07)
BASOS PCT: 0 %
Basophils Absolute: 0 10*3/uL (ref 0.0–0.1)
EOS PCT: 0 %
Eosinophils Absolute: 0 10*3/uL (ref 0.0–0.5)
Immature Granulocytes: 0 %
Lymphocytes Relative: 14 %
Lymphs Abs: 1 10*3/uL (ref 0.7–4.0)
MONOS PCT: 4 %
Monocytes Absolute: 0.3 10*3/uL (ref 0.1–1.0)
NEUTROS PCT: 82 %
Neutro Abs: 5.5 10*3/uL (ref 1.7–7.7)

## 2018-05-20 LAB — ETHANOL: Alcohol, Ethyl (B): <10 mg/dL (ref <10)

## 2018-05-20 LAB — PROTIME-INR
INR: 0.93
Prothrombin Time: 12.4 seconds (ref 11.4–15.2)

## 2018-05-20 LAB — RAPID URINE DRUG SCREEN, HOSP PERFORMED
Amphetamines: NOT DETECTED
BENZODIAZEPINES: NOT DETECTED
Barbiturates: NOT DETECTED
COCAINE: NOT DETECTED
OPIATES: NOT DETECTED
Tetrahydrocannabinol: NOT DETECTED

## 2018-05-20 LAB — URINALYSIS, ROUTINE W REFLEX MICROSCOPIC
BILIRUBIN URINE: NEGATIVE
Bacteria, UA: NONE SEEN
Glucose, UA: NEGATIVE mg/dL
Ketones, ur: 5 mg/dL — AB
LEUKOCYTES UA: NEGATIVE
Nitrite: NEGATIVE
PH: 7 (ref 5.0–8.0)
Protein, ur: NEGATIVE mg/dL
SPECIFIC GRAVITY, URINE: 1.004 — AB (ref 1.005–1.030)

## 2018-05-20 LAB — I-STAT TROPONIN, ED: TROPONIN I, POC: 0 ng/mL (ref 0.00–0.08)

## 2018-05-20 LAB — APTT: aPTT: 27 seconds (ref 24–36)

## 2018-05-20 MED ORDER — METOPROLOL TARTRATE 5 MG/5ML IV SOLN
2.5000 mg | Freq: Once | INTRAVENOUS | Status: AC
  Administered 2018-05-20: 2.5 mg via INTRAVENOUS
  Filled 2018-05-20: qty 5

## 2018-05-20 MED ORDER — SODIUM CHLORIDE 0.9 % IV BOLUS
1000.0000 mL | Freq: Once | INTRAVENOUS | Status: AC
  Administered 2018-05-20: 1000 mL via INTRAVENOUS

## 2018-05-20 NOTE — ED Notes (Signed)
Pt ambulated to bathroom with a steady gait

## 2018-05-20 NOTE — Discharge Instructions (Signed)
Repeat your blood pressure daily and follow up with your pcp regarding adding a blood pressure medication.

## 2018-05-20 NOTE — ED Provider Notes (Signed)
[facility] EMERGENCY DEPARTMENT Provider Note   CSN: [id number] Arrival date & time: 05/20/18  1212     History   Chief Complaint Chief Complaint  Patient presents with  . Dizziness    HPI Jennifer Cruz is a 84 y.o. female.  Pt presents to the ED today with dizziness.  Pt said she woke up and felt normal, but then around 0830, she felt dizzy.  She does not feel the room spinning.  She said she has not dizziness when lying in bed.  Her husband said she was slightly unsteady on her feet walking into the ED.  Her bp is elevated, and she has no hx of htn.  The pt denies n/v.  No weakness.  No trouble swallowing or talking.     Past Medical History:  Diagnosis Date  . Alzheimer disease ([place]) 04/16/2017  . Anxiety   . Depression   . Diverticulosis   . Dysrhythmia    palpitations  . Hyperlipidemia   . Hypertension   . Macular degeneration   . Memory loss    work up negative for Alzheimer's per medical records.  . Osteoarthritis   . Pulmonary nodules    per patient work-up completed at [facility] and no further evaluation needed    Patient Active Problem List   Diagnosis Date Noted  . Chronic bilateral low back pain without sciatica 04/22/2018  . Alzheimer disease ([place]) 04/16/2017  . Chronic left-sided low back pain with left-sided sciatica 11/04/2016  . Pain in left hip 11/04/2016  . Spondylolisthesis of lumbar region 11/04/2016  . Spondylosis without myelopathy or radiculopathy, lumbar region 11/04/2016  . Change in stool caliber 01/05/2015  . Encounter for screening colonoscopy 04/30/2012  . RLQ fullness 04/30/2012  . PALPITATIONS 02/23/2009  . VISUAL IMPAIRMENT 05/11/2008  . OSTEOARTHRITIS, KNEES, BILATERAL 02/18/2008  . ELEVATED BLOOD PRESSURE WITHOUT DIAGNOSIS OF HYPERTENSION 11/17/2007  . MEMORY LOSS 08/17/2007  . MRI, BRAIN, ABNORMAL 08/17/2007  . OSTEOPENIA 07/08/2007  . ANXIETY DEPRESSION 05/08/2007  . HYPERLIPIDEMIA 04/24/2007  . DECREASED HEARING  04/24/2007  . PULMONARY NODULE 04/24/2007    Past Surgical History:  Procedure Laterality Date  . BREAST ENHANCEMENT SURGERY    . CATARACT EXTRACTION  2009  . COLONOSCOPY WITH PROPOFOL N/A 09/17/2012   Dr.Rourk- normal rectum, pancolonic diverticulosis: o/w the remainder of the colonic mucosa appeared normal. it is notable the left colon was somewhat stiff and noncompliant.   . incomplete colonoscopy  2005   inadequate sedation, [facility]  . REFRACTIVE SURGERY  2010  . TONSILLECTOMY    . TOTAL KNEE ARTHROPLASTY  05/2009   right     OB History   None      Home Medications    Prior to Admission medications   Medication Sig Start Date End Date Taking? Authorizing Provider  aspirin EC 81 MG tablet Take 81 mg by mouth daily.   Yes [provider]  B Complex Vitamins (B COMPLEX PO) Take 1 tablet by mouth daily.  05/28/16  Yes [provider]  CALCIUM CITRATE-VITAMIN D PO Take 1 tablet by mouth daily.  05/28/16  Yes [provider]  Ibuprofen 200 MG CAPS Take 400-800 mg by mouth every 4 (four) hours as needed for fever. Takes 1-2 times daily   Yes [provider]  Memantine HCl-Donepezil HCl (NAMZARIC) 28-10 MG CP24 Take 1 capsule by mouth daily. 03/18/18  Yes Kathrynn Ducking, MD  Multiple Vitamins-Minerals (MULTIVITAMIN WITH MINERALS) tablet Take 1 tablet by mouth  daily.   Yes [provider]  sertraline (ZOLOFT) 25 MG tablet Take 1 tablet (25 mg total) by mouth daily. Patient not taking: Reported on 05/20/2018 04/20/18   Kathrynn Ducking, MD    Family History Family History  Problem Relation Age of Onset  . Diabetes Father   . Stroke Father   . Alzheimer's disease Mother     Social History Social History   Tobacco Use  . Smoking status: Former Smoker    Packs/day: 0.30    Years: 2.00    Pack years: 0.60    Types: Cigarettes    Last attempt to quit: 09/14/1952    Years since quitting: 65.7  . Smokeless tobacco: Never Used  .  Tobacco comment: quit in college  Substance Use Topics  . Alcohol use: Yes    Alcohol/week: 1.0 standard drinks    Types: 1 Glasses of wine per week    Comment: a glass of red wine every night  . Drug use: No     Allergies   Celecoxib; Amitriptyline hcl; and Prochlorperazine edisylate   Review of Systems Review of Systems  Neurological: Positive for dizziness.  All other systems reviewed and are negative.    Physical Exam Updated Vital Signs BP (!) 187/52   Pulse 66   Temp 97.9 F (36.6 C) (Temporal)   Resp 13   Ht 5\' 5"  (1.651 m)   Wt 56.7 kg   SpO2 100%   BMI 20.80 kg/m   Physical Exam  Constitutional: She is oriented to person, place, and time. She appears well-developed and well-nourished.  HENT:  Head: Normocephalic and atraumatic.  Right Ear: External ear normal.  Left Ear: External ear normal.  Nose: Nose normal.  Mouth/Throat: Oropharynx is clear and moist.  Eyes: Pupils are equal, round, and reactive to light. Conjunctivae and EOM are normal.  Neck: Normal range of motion. Neck supple.  Cardiovascular: Normal rate, regular rhythm, normal heart sounds and intact distal pulses.  Pulmonary/Chest: Effort normal and breath sounds normal.  Abdominal: Soft. Bowel sounds are normal.  Musculoskeletal: Normal range of motion.  Neurological: She is alert and oriented to person, place, and time.  Skin: Skin is warm. Capillary refill takes less than 2 seconds.  Psychiatric: She has a normal mood and affect. Her behavior is normal. Judgment and thought content normal.  Nursing note and vitals reviewed.    ED Treatments / Results  Labs (all labs ordered are listed, but only abnormal results are displayed) Labs Reviewed  COMPREHENSIVE METABOLIC PANEL - Abnormal; Notable for the following components:      Result Value   Glucose, Bld 131 (*)    Calcium 8.8 (*)    All other components within normal limits  URINALYSIS, ROUTINE W REFLEX MICROSCOPIC - Abnormal;  Notable for the following components:   Color, Urine STRAW (*)    Specific Gravity, Urine 1.004 (*)    Hgb urine dipstick SMALL (*)    Ketones, ur 5 (*)    All other components within normal limits  ETHANOL  PROTIME-INR  APTT  CBC  DIFFERENTIAL  RAPID URINE DRUG SCREEN, HOSP PERFORMED  I-STAT TROPONIN, ED    EKG EKG Interpretation  Date/Time:  Wednesday May 20 2018 12:40:05 EDT Ventricular Rate:  68 PR Interval:    QRS Duration: 88 QT Interval:  442 QTC Calculation: 471 R Axis:   51 Text Interpretation:  Sinus rhythm Probable anteroseptal infarct, old No significant change since last tracing Confirmed by Gilford Raid,  Almyra Free ([id number]) on 05/20/2018 12:53:18 PM   Radiology Dg Chest 2 View  Result Date: 05/20/2018 CLINICAL DATA:  Dizziness. EXAM: CHEST - 2 VIEW COMPARISON:  None. FINDINGS: The heart size and mediastinal contours are within normal limits. Atherosclerosis of thoracic aorta is noted. Both lungs are clear. The visualized skeletal structures are unremarkable. IMPRESSION: No active cardiopulmonary disease. Aortic Atherosclerosis (ICD10-I70.0). Electronically Signed   By: Marijo Conception, M.D.   On: 05/20/2018 14:30   Ct Head Wo Contrast  Result Date: 05/20/2018 CLINICAL DATA:  Dizziness this morning. EXAM: CT HEAD WITHOUT CONTRAST TECHNIQUE: Contiguous axial images were obtained from the base of the skull through the vertex without intravenous contrast. COMPARISON:  MRI brain 06/11/2016 FINDINGS: Brain: Ventricles, cisterns and other CSF spaces are normal. There is mild chronic ischemic microvascular disease. There is no mass, mass effect, shift of midline structures or acute hemorrhage. Minimal right basal ganglia calcification. No acute infarction. Vascular: No hyperdense vessel or unexpected calcification. Skull: Normal. Negative for fracture or focal lesion. Sinuses/Orbits: No acute finding. Other: None. IMPRESSION: No acute findings. Minimal chronic ischemic  microvascular disease. Electronically Signed   By: Marin Olp M.D.   On: 05/20/2018 14:49    Procedures Procedures (including critical care time)  Medications Ordered in ED Medications  metoprolol tartrate (LOPRESSOR) injection 2.5 mg (2.5 mg Intravenous Given 05/20/18 1332)  sodium chloride 0.9 % bolus 1,000 mL (0 mLs Intravenous Stopped 05/20/18 1509)     Initial Impression / Assessment and Plan / ED Course  I have reviewed the triage vital signs and the nursing notes.  Pertinent labs & imaging results that were available during my care of the patient were reviewed by me and considered in my medical decision making (see chart for details).    Pt is feeling much better.  She was given a small dose of lopressor (2.5 mg) and bp has come down to the 180s.  She was able to ambulate to the restroom without difficulties.  Her husband is a retired Engineer, drilling and will recheck her bp tomorrow and f/u with pcp.  For now, I will not start her on any bp meds.  Final Clinical Impressions(s) / ED Diagnoses   Final diagnoses:  Dizziness  Essential hypertension    ED Discharge Orders    None       Isla Pence, MD 05/20/18 [phone]

## 2018-05-20 NOTE — ED Triage Notes (Signed)
Pt sent from urgent care due to dizziness. bp was up there. Hx of alzheimers. Husband with pt. Pt feels unsteady. Pt was slightly unsteady on feet walking into triage. Denies trouble speaking/swallowing. Denies blurred vision.

## 2018-05-20 NOTE — ED Notes (Signed)
Pt ambulatory with assistance to the restroom

## 2018-05-22 ENCOUNTER — Encounter (HOSPITAL_COMMUNITY): Payer: Self-pay

## 2018-05-22 ENCOUNTER — Other Ambulatory Visit: Payer: Self-pay

## 2018-05-22 ENCOUNTER — Emergency Department (HOSPITAL_COMMUNITY): Payer: Medicare HMO

## 2018-05-22 ENCOUNTER — Emergency Department (HOSPITAL_COMMUNITY)
Admission: EM | Admit: 2018-05-22 | Discharge: 2018-05-22 | Disposition: A | Discharge status: Home / Self Care | Payer: Medicare HMO | Attending: Emergency Medicine | Admitting: Emergency Medicine

## 2018-05-22 DIAGNOSIS — R42 Dizziness and giddiness: Secondary | ICD-10-CM | POA: Diagnosis not present

## 2018-05-22 DIAGNOSIS — F0280 Dementia in other diseases classified elsewhere without behavioral disturbance: Secondary | ICD-10-CM | POA: Insufficient documentation

## 2018-05-22 DIAGNOSIS — I10 Essential (primary) hypertension: Secondary | ICD-10-CM

## 2018-05-22 DIAGNOSIS — Z87891 Personal history of nicotine dependence: Secondary | ICD-10-CM | POA: Insufficient documentation

## 2018-05-22 DIAGNOSIS — Z79899 Other long term (current) drug therapy: Secondary | ICD-10-CM | POA: Diagnosis not present

## 2018-05-22 DIAGNOSIS — G309 Alzheimer's disease, unspecified: Secondary | ICD-10-CM | POA: Insufficient documentation

## 2018-05-22 DIAGNOSIS — Z96651 Presence of right artificial knee joint: Secondary | ICD-10-CM | POA: Insufficient documentation

## 2018-05-22 MED ORDER — HYDROCHLOROTHIAZIDE 25 MG PO TABS: 25.0000 mg | ORAL_TABLET | Freq: Every day | ORAL | 2 refills | Status: AC

## 2018-05-22 NOTE — ED Notes (Signed)
Gave patient warm blanket.

## 2018-05-22 NOTE — Discharge Instructions (Addendum)
Start taking the hydrochlorothiazide for the high blood pressure.  Follow-up with Dr. Willey Blade upon his return from vacation.  Return here for any new or worse symptoms.  MRI here today had no acute findings.  No evidence of stroke.

## 2018-05-22 NOTE — ED Triage Notes (Signed)
Pt was seen on Wednesday for high blood pressure. Pt reports BP is still high with systolic over 761 at home. Reports dizziness. PCP on vacation

## 2018-05-22 NOTE — ED Provider Notes (Signed)
[facility] EMERGENCY DEPARTMENT Provider Note   CSN: [id number] Arrival date & time: 05/22/18  0920     History   Chief Complaint Chief Complaint  Patient presents with  . Hypertension    HPI Jennifer Cruz is a 84 y.o. female.  Patient just seen October 23 for similar complaints.  Extensive work-up in the emergency department for this.  Patient had noted that day that her blood pressure was very high with systolics of 267.  No prior history of hypertension.  Last time it would have been checked what it may be been several months ago.  Followed by Dr. Willey Blade.  He is on vacation this week.  Patient also with a complaint of intermittent dizziness questionable vertigo that has been ongoing this week.  No severe headaches no chest pain no other strokelike symptoms.  No shortness of breath.  No leg swelling.  Patient husband does let us know that she is in the early phases of some dementia.     Past Medical History:  Diagnosis Date  . Alzheimer disease ([employer]) 04/16/2017  . Anxiety   . Depression   . Diverticulosis   . Dysrhythmia    palpitations  . Hyperlipidemia   . Hypertension   . Macular degeneration   . Memory loss    work up negative for Alzheimer's per medical records.  . Osteoarthritis   . Pulmonary nodules    per patient work-up completed at [facility] and no further evaluation needed    Patient Active Problem List   Diagnosis Date Noted  . Chronic bilateral low back pain without sciatica 04/22/2018  . Alzheimer disease ([place]) 04/16/2017  . Chronic left-sided low back pain with left-sided sciatica 11/04/2016  . Pain in left hip 11/04/2016  . Spondylolisthesis of lumbar region 11/04/2016  . Spondylosis without myelopathy or radiculopathy, lumbar region 11/04/2016  . Change in stool caliber 01/05/2015  . Encounter for screening colonoscopy 04/30/2012  . RLQ fullness 04/30/2012  . PALPITATIONS 02/23/2009  . VISUAL IMPAIRMENT 05/11/2008  . OSTEOARTHRITIS, KNEES,  BILATERAL 02/18/2008  . ELEVATED BLOOD PRESSURE WITHOUT DIAGNOSIS OF HYPERTENSION 11/17/2007  . MEMORY LOSS 08/17/2007  . MRI, BRAIN, ABNORMAL 08/17/2007  . OSTEOPENIA 07/08/2007  . ANXIETY DEPRESSION 05/08/2007  . HYPERLIPIDEMIA 04/24/2007  . DECREASED HEARING 04/24/2007  . PULMONARY NODULE 04/24/2007    Past Surgical History:  Procedure Laterality Date  . BREAST ENHANCEMENT SURGERY    . CATARACT EXTRACTION  2009  . COLONOSCOPY WITH PROPOFOL N/A 09/17/2012   Dr.Rourk- normal rectum, pancolonic diverticulosis: o/w the remainder of the colonic mucosa appeared normal. it is notable the left colon was somewhat stiff and noncompliant.   . incomplete colonoscopy  2005   inadequate sedation, [facility]  . REFRACTIVE SURGERY  2010  . TONSILLECTOMY    . TOTAL KNEE ARTHROPLASTY  05/2009   right     OB History   None      Home Medications    Prior to Admission medications   Medication Sig Start Date End Date Taking? Authorizing Provider  B Complex Vitamins (B COMPLEX PO) Take 1 tablet by mouth daily.  05/28/16  Yes [provider]  CALCIUM CITRATE-VITAMIN D PO Take 1 tablet by mouth daily.  05/28/16  Yes [provider]  Ibuprofen 200 MG CAPS Take 400-800 mg by mouth every 4 (four) hours as needed (arthritis). Takes 1-2 times daily   Yes [provider]  Memantine HCl-Donepezil HCl (NAMZARIC) 28-10 MG CP24 Take 1 capsule by mouth daily.  03/18/18  Yes Kathrynn Ducking, MD  Multiple Vitamins-Minerals (MULTIVITAMIN WITH MINERALS) tablet Take 1 tablet by mouth daily.   Yes [provider]  hydrochlorothiazide (HYDRODIURIL) 25 MG tablet Take 1 tablet (25 mg total) by mouth daily. 05/22/18   Fredia Sorrow, MD    Family History Family History  Problem Relation Age of Onset  . Diabetes Father   . Stroke Father   . Alzheimer's disease Mother     Social History Social History   Tobacco Use  . Smoking status: Former Smoker    Packs/day: 0.30     Years: 2.00    Pack years: 0.60    Types: Cigarettes    Last attempt to quit: 09/14/1952    Years since quitting: 65.7  . Smokeless tobacco: Never Used  . Tobacco comment: quit in college  Substance Use Topics  . Alcohol use: Yes    Alcohol/week: 1.0 standard drinks    Types: 1 Glasses of wine per week    Comment: a glass of red wine every night  . Drug use: No     Allergies   Celecoxib; Amitriptyline hcl; and Prochlorperazine edisylate   Review of Systems Review of Systems  Constitutional: Negative for fever.  HENT: Negative for congestion.   Eyes: Negative for visual disturbance.  Respiratory: Negative for shortness of breath.   Cardiovascular: Negative for chest pain, palpitations and leg swelling.  Gastrointestinal: Negative for abdominal pain, nausea and vomiting.  Genitourinary: Negative for dysuria.  Musculoskeletal: Negative for back pain.  Skin: Negative for rash.  Neurological: Positive for dizziness. Negative for headaches.  Hematological: Does not bruise/bleed easily.  Psychiatric/Behavioral: Positive for confusion.     Physical Exam Updated Vital Signs BP (!) 180/78   Pulse 77   Temp 98.5 F (36.9 C) (Oral)   Resp 15   Wt 56.7 kg   SpO2 100%   BMI 20.80 kg/m   Physical Exam  Constitutional: She is oriented to person, place, and time. She appears well-developed and well-nourished. No distress.  HENT:  Head: Normocephalic and atraumatic.  Mouth/Throat: Oropharynx is clear and moist.  Eyes: Pupils are equal, round, and reactive to light. Conjunctivae and EOM are normal.  Neck: Neck supple.  Cardiovascular: Normal rate, regular rhythm and normal heart sounds.  Pulmonary/Chest: Effort normal and breath sounds normal.  Abdominal: Soft. Bowel sounds are normal. There is no tenderness.  Musculoskeletal: Normal range of motion. She exhibits no edema.  Neurological: She is alert and oriented to person, place, and time. No cranial nerve deficit or sensory  deficit. She exhibits normal muscle tone. Coordination normal.  Skin: Skin is warm. No rash noted.  Nursing note and vitals reviewed.    ED Treatments / Results  Labs (all labs ordered are listed, but only abnormal results are displayed) Labs Reviewed - No data to display  EKG None  Radiology Dg Chest 2 View  Result Date: 05/20/2018 CLINICAL DATA:  Dizziness. EXAM: CHEST - 2 VIEW COMPARISON:  None. FINDINGS: The heart size and mediastinal contours are within normal limits. Atherosclerosis of thoracic aorta is noted. Both lungs are clear. The visualized skeletal structures are unremarkable. IMPRESSION: No active cardiopulmonary disease. Aortic Atherosclerosis (ICD10-I70.0). Electronically Signed   By: Marijo Conception, M.D.   On: 05/20/2018 14:30   Ct Head Wo Contrast  Result Date: 05/20/2018 CLINICAL DATA:  Dizziness this morning. EXAM: CT HEAD WITHOUT CONTRAST TECHNIQUE: Contiguous axial images were obtained from the base of the skull through the vertex without  intravenous contrast. COMPARISON:  MRI brain 06/11/2016 FINDINGS: Brain: Ventricles, cisterns and other CSF spaces are normal. There is mild chronic ischemic microvascular disease. There is no mass, mass effect, shift of midline structures or acute hemorrhage. Minimal right basal ganglia calcification. No acute infarction. Vascular: No hyperdense vessel or unexpected calcification. Skull: Normal. Negative for fracture or focal lesion. Sinuses/Orbits: No acute finding. Other: None. IMPRESSION: No acute findings. Minimal chronic ischemic microvascular disease. Electronically Signed   By: Marin Olp M.D.   On: 05/20/2018 14:49   Mr Brain Wo Contrast (neuro Protocol)  Result Date: 05/22/2018 CLINICAL DATA:  Hypertension and dizziness.  Alzheimer's dementia. EXAM: MRI HEAD WITHOUT CONTRAST TECHNIQUE: Multiplanar, multiecho pulse sequences of the brain and surrounding structures were obtained without intravenous contrast. COMPARISON:   CT 05/20/2018 FINDINGS: Brain: Diffusion imaging does not show any acute or subacute infarction. There is generalized atrophy. No focal abnormality affects the brainstem or cerebellum. Cerebral hemispheres show mild to moderate changes of chronic small vessel disease of the deep white matter, typical for age. No cortical or large vessel territory infarction. No mass lesion, hemorrhage, hydrocephalus or extra-axial collection. Vascular: Major vessels at the base of the brain show flow. Skull and upper cervical spine: Negative Sinuses/Orbits: Sinuses are clear. Orbits are negative. Small amount of fluid at the mastoid tip on the right, not significant. Other: None IMPRESSION: No acute or reversible finding. Generalized atrophy without lobar predominance. Mild chronic small-vessel ischemic change of the cerebral hemispheric white matter. Electronically Signed   By: Nelson Chimes M.D.   On: 05/22/2018 12:16    Procedures Procedures (including critical care time)  Medications Ordered in ED Medications - No data to display   Initial Impression / Assessment and Plan / ED Course  I have reviewed the triage vital signs and the nursing notes.  Pertinent labs & imaging results that were available during my care of the patient were reviewed by me and considered in my medical decision making (see chart for details).    Based on the blood pressures they have been getting at home with systolics anywhere from 161 200 and the documented blood pressures from the visit on October 23.  Patient probably is showing evidence of hypertension.  Underwent MRI brain to rule out stroke.  On the 23rd patient had a chest x-ray CT head and basic labs without any significant abnormalities.  So patient started on hydrochlorothiazide.  She will follow back up with her primary care doctor next week.  Patient will continue to keep a daily log of her blood pressures.  They will return for any new or worse symptoms.  Patient without any  significant endorgan dysfunction. Final Clinical Impressions(s) / ED Diagnoses   Final diagnoses:  Essential hypertension  Dizziness    ED Discharge Orders         Ordered    hydrochlorothiazide (HYDRODIURIL) 25 MG tablet  Daily     05/22/18 1248           Fredia Sorrow, MD 05/22/18 1534

## 2018-06-01 DIAGNOSIS — Z23 Encounter for immunization: Secondary | ICD-10-CM | POA: Diagnosis not present

## 2018-06-01 DIAGNOSIS — I10 Essential (primary) hypertension: Secondary | ICD-10-CM | POA: Diagnosis not present

## 2018-06-03 ENCOUNTER — Ambulatory Visit (INDEPENDENT_AMBULATORY_CARE_PROVIDER_SITE_OTHER): Payer: Medicare HMO | Admitting: Orthopaedic Surgery

## 2018-06-22 DIAGNOSIS — E785 Hyperlipidemia, unspecified: Secondary | ICD-10-CM | POA: Diagnosis not present

## 2018-06-22 DIAGNOSIS — I10 Essential (primary) hypertension: Secondary | ICD-10-CM | POA: Diagnosis not present

## 2018-06-22 DIAGNOSIS — Z79899 Other long term (current) drug therapy: Secondary | ICD-10-CM | POA: Diagnosis not present

## 2018-06-22 DIAGNOSIS — R413 Other amnesia: Secondary | ICD-10-CM | POA: Diagnosis not present

## 2018-06-29 DIAGNOSIS — I700 Atherosclerosis of aorta: Secondary | ICD-10-CM | POA: Diagnosis not present

## 2018-06-29 DIAGNOSIS — G309 Alzheimer's disease, unspecified: Secondary | ICD-10-CM | POA: Diagnosis not present

## 2018-06-29 DIAGNOSIS — I10 Essential (primary) hypertension: Secondary | ICD-10-CM | POA: Diagnosis not present

## 2018-06-29 DIAGNOSIS — Z6822 Body mass index (BMI) 22.0-22.9, adult: Secondary | ICD-10-CM | POA: Diagnosis not present

## 2018-06-29 DIAGNOSIS — M1991 Primary osteoarthritis, unspecified site: Secondary | ICD-10-CM | POA: Diagnosis not present

## 2018-06-29 DIAGNOSIS — Z0001 Encounter for general adult medical examination with abnormal findings: Secondary | ICD-10-CM | POA: Diagnosis not present

## 2018-07-09 DIAGNOSIS — L820 Inflamed seborrheic keratosis: Secondary | ICD-10-CM | POA: Diagnosis not present

## 2018-07-09 DIAGNOSIS — B078 Other viral warts: Secondary | ICD-10-CM | POA: Diagnosis not present

## 2018-07-31 ENCOUNTER — Ambulatory Visit: Payer: Self-pay | Admitting: Mental Health

## 2018-08-03 ENCOUNTER — Telehealth: Payer: Self-pay | Admitting: Neurology

## 2018-08-03 MED ORDER — SERTRALINE HCL 50 MG PO TABS: 50.0000 mg | ORAL_TABLET | Freq: Every day | ORAL | 1 refills | Status: DC

## 2018-08-03 NOTE — Telephone Encounter (Signed)
Pt and pts husband called stating the pt has been tolerating medication sertraline (ZOLOFT) 25 MG tablet well, would like to discuss if Dr. Jannifer Franklin wants to increase the dosage. Please advise

## 2018-08-03 NOTE — Telephone Encounter (Signed)
I called the patient, talk with the son.  The patient is tolerated the 25 mg dose of the Zoloft, this seems to be helpful, we will go up to the 50 mg dosing of the medication.  A prescription was sent in.

## 2018-08-10 ENCOUNTER — Encounter: Payer: Self-pay | Admitting: Mental Health

## 2018-08-10 ENCOUNTER — Ambulatory Visit (INDEPENDENT_AMBULATORY_CARE_PROVIDER_SITE_OTHER): Payer: Medicare HMO | Admitting: Mental Health

## 2018-08-10 DIAGNOSIS — F331 Major depressive disorder, recurrent, moderate: Secondary | ICD-10-CM | POA: Diagnosis not present

## 2018-08-10 DIAGNOSIS — F411 Generalized anxiety disorder: Secondary | ICD-10-CM

## 2018-08-10 NOTE — Progress Notes (Signed)
[facility] Initial Adult Exam- Part I  Name: Jennifer Cruz Date: 08/10/2018 MRN: [id number] DOB: 09/24/1930 PCP: Asencion Noble, MD  Time spent: 40 minutes   Guardian/Payee: patient  Paperwork requested:  No   Reason for Visit /Presenting Problem: Losing memory. Wanted prescription. Referred to Dr. Clovis Pu  Mental Status Exam:   Appearance:   Well Groomed     Behavior:  Resistant  Motor:  Normal  Speech/Language:   Normal Rate  Affect:  Flat  Mood:  constricted and irritable  Thought process:  resistant  Thought content:    Rumination  Sensory/Perceptual disturbances:    WNL  Orientation:  oriented to person, place and time/date  Attention:  Fair  Concentration:  Fair  Memory:  poor  Fund of knowledge:   Fair  Insight:    Fair  Judgment:   Fair  Impulse Control:  Fair   Reported Symptoms:  concerned about memory loss  Risk Assessment: Danger to Self:  No Self-injurious Behavior: No Danger to Others: No Duty to Warn:no Physical Aggression / Violence:No  Access to Firearms a concern: No  Gang Involvement:No  Patient / guardian was educated about steps to take if suicide or homicide risk level increases between visits: no While future psychiatric events cannot be accurately predicted, the patient does not currently require acute inpatient psychiatric care and does not currently meet [facility] involuntary commitment criteria.  Substance Abuse History: Current substance abuse: No     Past Psychiatric History:   No previous psychological problems have been observed Outpatient Providers:unknown History of Psych Hospitalization: No  Psychological Testing: none    Medical History/Surgical History:not reviewed Past Medical History:  Diagnosis Date  . Alzheimer disease ([place]) 04/16/2017  . Anxiety   . Depression   . Diverticulosis   . Dysrhythmia    palpitations  . Hyperlipidemia   . Hypertension   . Macular degeneration   . Memory loss    work  up negative for Alzheimer's per medical records.  . Osteoarthritis   . Pulmonary nodules    per [facility] at [facility] and no further evaluation needed    Past Surgical History:  Procedure Laterality Date  . BREAST ENHANCEMENT SURGERY    . CATARACT EXTRACTION  2009  . COLONOSCOPY WITH PROPOFOL N/A 09/17/2012   Dr.Rourk- normal rectum, pancolonic diverticulosis: o/w the remainder of the colonic mucosa appeared normal. it is notable the left colon was somewhat stiff and noncompliant.   . incomplete colonoscopy  2005   inadequate sedation, [facility]  . REFRACTIVE SURGERY  2010  . TONSILLECTOMY    . TOTAL KNEE ARTHROPLASTY  05/2009   right    Medications: Current Outpatient Medications  Medication Sig Dispense Refill  . B Complex Vitamins (B COMPLEX PO) Take 1 tablet by mouth daily.     Marland Kitchen CALCIUM CITRATE-VITAMIN D PO Take 1 tablet by mouth daily.     . hydrochlorothiazide (HYDRODIURIL) 25 MG tablet Take 1 tablet (25 mg total) by mouth daily. 14 tablet 2  . Ibuprofen 200 MG CAPS Take 400-800 mg by mouth every 4 (four) hours as needed (arthritis). Takes 1-2 times daily    . Memantine HCl-Donepezil HCl (NAMZARIC) 28-10 MG CP24 Take 1 capsule by mouth daily. 90 capsule 2  . Multiple Vitamins-Minerals (MULTIVITAMIN WITH MINERALS) tablet Take 1 tablet by mouth daily.    . sertraline (ZOLOFT) 50 MG tablet Take 1 tablet (50 mg total) by mouth daily. 90 tablet 1   No current facility-administered  medications for this visit.     Allergies  Allergen Reactions  . Celecoxib Hives  . Amitriptyline Hcl     REACTION: Palpatations  . Prochlorperazine Edisylate     REACTION: Used for nausea and developed tardive dyskinesia    Diagnoses:    ICD-10-CM   1. Major depressive disorder, recurrent episode, moderate (HCC) F33.1   2. Generalized anxiety disorder F41.1      Part II to be continued at next session:   No.   Rosary Lively, LPC    Abuse History: Victim: none Report needed:  no Perpetrator of abuse: no Witness / Exposure to Domestic Violence:  none Protective Services Involvement: no Witness to [employer] Violence:  no   Family / Social History:    Living situation:  In home with husband Sexual Orientation: straight Relationship Status:   Married Name of spouse / other: Duard Brady If a parent, number of children / ages:  [place] and [place]: Family  Financial Stress:   none  Income/Employment/Disability:     Armed forces logistics/support/administrative officer: none  Educational History:   Wellsite geologist:    Not religious  Any cultural differences that may affect / interfere with treatment:  no  Recreation/Hobbies: Painting, gardening  Stressors:  Memory loss  Strengths:  Film/video editor, intelligent, articulate  Barriers: none  Legal History:none known  Pending legal issue / charges: none  History of legal issue / charges: none   Diagnosis:  Adjustment disorder mixed. R/O MDD and GAD

## 2018-09-01 ENCOUNTER — Ambulatory Visit: Payer: Medicare HMO | Admitting: Mental Health

## 2018-09-08 ENCOUNTER — Ambulatory Visit: Payer: Medicare HMO | Admitting: Neurology

## 2018-09-08 ENCOUNTER — Ambulatory Visit: Payer: Medicare HMO | Admitting: Mental Health

## 2018-09-09 ENCOUNTER — Encounter (INDEPENDENT_AMBULATORY_CARE_PROVIDER_SITE_OTHER): Payer: Self-pay | Admitting: Orthopaedic Surgery

## 2018-09-09 ENCOUNTER — Ambulatory Visit (INDEPENDENT_AMBULATORY_CARE_PROVIDER_SITE_OTHER): Payer: Medicare HMO | Admitting: Orthopaedic Surgery

## 2018-09-09 VITALS — BP 143/75 | HR 83 | Ht 65.0 in | Wt 124.0 lb

## 2018-09-09 DIAGNOSIS — M25562 Pain in left knee: Secondary | ICD-10-CM | POA: Diagnosis not present

## 2018-09-09 DIAGNOSIS — G8929 Other chronic pain: Secondary | ICD-10-CM | POA: Diagnosis not present

## 2018-09-09 DIAGNOSIS — M5442 Lumbago with sciatica, left side: Secondary | ICD-10-CM | POA: Diagnosis not present

## 2018-09-09 MED ORDER — BUPIVACAINE HCL 0.5 % IJ SOLN
2.0000 mL | INTRAMUSCULAR | Status: AC | PRN
  Administered 2018-09-09: 2 mL via INTRA_ARTICULAR

## 2018-09-09 MED ORDER — LIDOCAINE HCL 1 % IJ SOLN
2.0000 mL | INTRAMUSCULAR | Status: AC | PRN
  Administered 2018-09-09: 2 mL

## 2018-09-09 MED ORDER — METHYLPREDNISOLONE ACETATE 40 MG/ML IJ SUSP
80.0000 mg | INTRAMUSCULAR | Status: AC | PRN
  Administered 2018-09-09: 80 mg via INTRA_ARTICULAR

## 2018-09-09 NOTE — Progress Notes (Signed)
Office Visit Note   Patient: Jennifer Cruz           Date of Birth: [birth year]           MRN: [id number] Visit Date: 09/09/2018              Requested by: Asencion Noble, MD [address] [place], [place] [address] PCP: Asencion Noble, MD   Assessment & Plan: Visit Diagnoses:  1. Chronic left-sided low back pain with left-sided sciatica   2. Chronic pain of left knee     Plan: Osteoarthritis left knee with near bone-on-bone.  Long discussion regarding prior diagnosis and treatment options including medicines injections bracing therapy Visco supplementation.  She like to try another shot of cortisone.  I have also discussed knee replacement and she does not have any interest at this point.  [name] also has chronic problems with her lumbar spine diagnosed with MRI scan.  She has been evaluated by Dr. Ernestina Patches who thought that he had provided all he could in terms of injections.  After much discussion I would like Dr. Lorin Mercy to evaluate her for another opinion  Follow-Up Instructions: Return if symptoms worsen or fail to improve.   Orders:  Orders Placed This Encounter  Procedures  . Large Joint Inj: L knee   No orders of the defined types were placed in this encounter.     Procedures: Large Joint Inj: L knee on 09/09/2018 2:46 PM Indications: pain and diagnostic evaluation Details: 25 G 1.5 in needle, anteromedial approach  Arthrogram: No  Medications: 2 mL lidocaine 1 %; 2 mL bupivacaine 0.5 %; 80 mg methylPREDNISolone acetate 40 MG/ML Procedure, treatment alternatives, risks and benefits explained, specific risks discussed. Consent was given by the patient. Patient was prepped and draped in the usual sterile fashion.       Clinical Data: No additional findings.   Subjective: Chief Complaint  Patient presents with  . Lower Back - Pain  . Left Knee - Pain  Patient returns with her husband. She has questions written down to go over.  She states that she  continues to have low back pain. She does feel, however, that she is able to stand it.  She has had previous injections with Dr. Ernestina Patches but states the last one was not at all effective.  Her husband states that Dr. Ernestina Patches has "exhausted all possibilities" for her pain.   Patient also complains with left knee pain. She has had previous cortisone injections of the left knee, the last of which was 04/08/18.  She did get some relief from this injections. She is taking advil for pain and questions how she should take it [name] is not experiencing any instability with her left knee but certainly has pain with certain activities.  She has not used any bracing.  She takes up to 6 Advil a day she recently had lab work through Dr. Ria Comment office and it was "okay".  She is somewhat frustrated with her back which has not responded to treatment as outlined in the past including medicines, exercises and injections per Dr. Ernestina Patches.  She had an MRI scan in September.  HPI  Review of Systems   Objective: Vital Signs: BP (!) 143/75   Pulse 83   Ht 5\' 5"  (1.651 m)   Wt 124 lb (56.2 kg)   BMI 20.63 kg/m   Physical Exam Constitutional:      Appearance: She is well-developed.  Eyes:  Pupils: Pupils are equal, round, and reactive to light.  Pulmonary:     Effort: Pulmonary effort is normal.  Skin:    General: Skin is warm and dry.  Neurological:     Mental Status: She is alert and oriented to person, place, and time.  Psychiatric:        Behavior: Behavior normal.     Ortho Exam awake alert and oriented x3.  Comfortable sitting.  Some percussible tenderness in the lumbar spine but none over the sacroiliac joints.  No pain with range of motion of either hip straight leg raise was negative.  Had a very small effusion of the left knee with some medial joint pain and increased varus with weightbearing.  Full extension and flexed about 105 degrees without instability.  No calf pain.  Motor exam intact  distally.  Specialty Comments:  No specialty comments available.  Imaging: No results found.   PMFS History: Patient Active Problem List   Diagnosis Date Noted  . Major depressive disorder, recurrent episode, moderate ([place]) 08/10/2018  . Generalized anxiety disorder 08/10/2018  . Chronic bilateral low back pain without sciatica 04/22/2018  . Alzheimer disease ([place]) 04/16/2017  . Chronic left-sided low back pain with left-sided sciatica 11/04/2016  . Pain in left hip 11/04/2016  . Spondylolisthesis of lumbar region 11/04/2016  . Spondylosis without myelopathy or radiculopathy, lumbar region 11/04/2016  . Change in stool caliber 01/05/2015  . Encounter for screening colonoscopy 04/30/2012  . RLQ fullness 04/30/2012  . PALPITATIONS 02/23/2009  . VISUAL IMPAIRMENT 05/11/2008  . OSTEOARTHRITIS, KNEES, BILATERAL 02/18/2008  . ELEVATED BLOOD PRESSURE WITHOUT DIAGNOSIS OF HYPERTENSION 11/17/2007  . MEMORY LOSS 08/17/2007  . MRI, BRAIN, ABNORMAL 08/17/2007  . OSTEOPENIA 07/08/2007  . ANXIETY DEPRESSION 05/08/2007  . HYPERLIPIDEMIA 04/24/2007  . DECREASED HEARING 04/24/2007  . PULMONARY NODULE 04/24/2007   Past Medical History:  Diagnosis Date  . Alzheimer disease ([place]) 04/16/2017  . Anxiety   . Depression   . Diverticulosis   . Dysrhythmia    palpitations  . Hyperlipidemia   . Hypertension   . Macular degeneration   . Memory loss    work up negative for Alzheimer's per medical records.  . Osteoarthritis   . Pulmonary nodules    per patient work-up completed at [facility] and no further evaluation needed    Family History  Problem Relation Age of Onset  . Diabetes Father   . Stroke Father   . Alzheimer's disease Mother     Past Surgical History:  Procedure Laterality Date  . BREAST ENHANCEMENT SURGERY    . CATARACT EXTRACTION  2009  . COLONOSCOPY WITH PROPOFOL N/A 09/17/2012   Dr.Rourk- normal rectum, pancolonic diverticulosis: o/w the remainder of the colonic mucosa  appeared normal. it is notable the left colon was somewhat stiff and noncompliant.   . incomplete colonoscopy  2005   inadequate sedation, [facility]  . REFRACTIVE SURGERY  2010  . TONSILLECTOMY    . TOTAL KNEE ARTHROPLASTY  05/2009   right   Social History   Occupational History  . Occupation: Retired    Comment: Patent attorney, Training and development officer  Tobacco Use  . Smoking status: Former Smoker    Packs/day: 0.30    Years: 2.00    Pack years: 0.60    Types: Cigarettes    Last attempt to quit: 09/14/1952    Years since quitting: 66.0  . Smokeless tobacco: Never Used  . Tobacco comment: quit in college  Substance and Sexual Activity  .  Alcohol use: Yes    Alcohol/week: 1.0 standard drinks    Types: 1 Glasses of wine per week    Comment: a glass of red wine every night  . Drug use: No  . Sexual activity: Yes    Birth control/protection: Post-menopausal

## 2018-09-10 ENCOUNTER — Ambulatory Visit: Payer: Medicare HMO | Admitting: Neurology

## 2018-09-10 ENCOUNTER — Other Ambulatory Visit: Payer: Self-pay

## 2018-09-10 ENCOUNTER — Encounter: Payer: Self-pay | Admitting: Neurology

## 2018-09-10 VITALS — BP 173/69 | HR 78 | Resp 16 | Ht 65.0 in | Wt 129.0 lb

## 2018-09-10 DIAGNOSIS — R413 Other amnesia: Secondary | ICD-10-CM

## 2018-09-10 DIAGNOSIS — F411 Generalized anxiety disorder: Secondary | ICD-10-CM

## 2018-09-10 MED ORDER — SERTRALINE HCL 25 MG PO TABS: 25.0000 mg | ORAL_TABLET | Freq: Every day | ORAL | 3 refills | Status: DC

## 2018-09-10 NOTE — Progress Notes (Signed)
Reason for visit: Memory disturbance  AERICA Cruz is an 85 y.o. female  History of present illness:  Jennifer Cruz is an 85 year old right-handed white female with a history of a mild memory disturbance.  The patient has had some problems with anxiety and frustration with her memory issues.  The patient is on Namzeric, she is tolerating the medication well.  She is on Zoloft 50 mg daily but still has some troubles with anxiety and frustration.  The patient is still operating a motor vehicle, she for the most part functions independently.  The patient returns to this office for further evaluation.  Past Medical History:  Diagnosis Date  . Alzheimer disease ([place]) 04/16/2017  . Anxiety   . Depression   . Diverticulosis   . Dysrhythmia    palpitations  . Hyperlipidemia   . Hypertension   . Macular degeneration   . Memory loss    work up negative for Alzheimer's per medical records.  . Osteoarthritis   . Pulmonary nodules    per patient work-up completed at [facility] and no further evaluation needed    Past Surgical History:  Procedure Laterality Date  . BREAST ENHANCEMENT SURGERY    . CATARACT EXTRACTION  2009  . COLONOSCOPY WITH PROPOFOL N/A 09/17/2012   Dr.Rourk- normal rectum, pancolonic diverticulosis: o/w the remainder of the colonic mucosa appeared normal. it is notable the left colon was somewhat stiff and noncompliant.   . incomplete colonoscopy  2005   inadequate sedation, [facility]  . REFRACTIVE SURGERY  2010  . TONSILLECTOMY    . TOTAL KNEE ARTHROPLASTY  05/2009   right    Family History  Problem Relation Age of Onset  . Diabetes Father   . Stroke Father   . Alzheimer's disease Mother     Social history:  reports that she quit smoking about 66 years ago. Her smoking use included cigarettes. She has a 0.60 pack-year smoking history. She has never used smokeless tobacco. She reports current alcohol use of about 1.0 standard drinks of alcohol per week. She reports that  she does not use drugs.    Allergies  Allergen Reactions  . Celecoxib Hives  . Amitriptyline Hcl     REACTION: Palpatations  . Prochlorperazine Edisylate     REACTION: Used for nausea and developed tardive dyskinesia    Medications:  Prior to Admission medications   Medication Sig Start Date End Date Taking? Authorizing Provider  B Complex Vitamins (B COMPLEX PO) Take 1 tablet by mouth daily.  05/28/16  Yes [provider]  CALCIUM CITRATE-VITAMIN D PO Take 1 tablet by mouth daily.  05/28/16  Yes [provider]  Ibuprofen 200 MG CAPS Take 400-800 mg by mouth every 4 (four) hours as needed (arthritis). Takes 1-2 times daily   Yes [provider]  Memantine HCl-Donepezil HCl (NAMZARIC) 28-10 MG CP24 Take 1 capsule by mouth daily. 03/18/18  Yes Kathrynn Ducking, MD  Multiple Vitamins-Minerals (MULTIVITAMIN WITH MINERALS) tablet Take 1 tablet by mouth daily.   Yes [provider]  sertraline (ZOLOFT) 50 MG tablet Take 1 tablet (50 mg total) by mouth daily. 08/03/18  Yes Kathrynn Ducking, MD  hydrochlorothiazide (HYDRODIURIL) 25 MG tablet Take 1 tablet (25 mg total) by mouth daily. Patient not taking: Reported on 09/10/2018 05/22/18   Fredia Sorrow, MD    ROS:  Out of a complete 14 system review of symptoms, the patient complains only of the following symptoms, and all other reviewed  systems are negative.  Memory disturbance  Blood pressure (!) 173/69, pulse 78, resp. rate 16, height 5\' 5"  (1.651 m), weight 129 lb (58.5 kg).  Physical Exam  General: The patient is alert and cooperative at the time of the examination.  Skin: No significant peripheral edema is noted.   Neurologic Exam  Mental status: The patient is alert and oriented x 3 at the time of the examination. The Mini-Mental status examination done today shows a total score 26/30, the patient is able to name 10 four-legged animals in 1 minute.   Cranial nerves: Facial symmetry is  present. Speech is normal, no aphasia or dysarthria is noted. Extraocular movements are full. Visual fields are full.  Motor: The patient has good strength in all 4 extremities.  Sensory examination: Soft touch sensation is symmetric on the face, arms, and legs.  Coordination: The patient has good finger-nose-finger and heel-to-shin bilaterally.  Gait and station: The patient has a normal gait. Tandem gait is slightly unsteady. Romberg is negative. No drift is seen.  Reflexes: Deep tendon reflexes are symmetric.   Assessment/Plan:  1.  Memory disturbance  2.  Anxiety, depression  Zoloft will be increased to 75 mg daily, a 25 mg tablet was sent in, this will be combined with the 50 mg tablet.  The patient will continue the Namzeric, they will follow-up in 6 months.  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  Jill Alexanders MD 09/10/2018 1:58 PM  [facility] [address] [place], [place] [address]  Phone [phone] Fax [phone]

## 2018-09-17 ENCOUNTER — Ambulatory Visit (INDEPENDENT_AMBULATORY_CARE_PROVIDER_SITE_OTHER): Payer: Medicare HMO | Admitting: Orthopaedic Surgery

## 2018-09-17 ENCOUNTER — Encounter (INDEPENDENT_AMBULATORY_CARE_PROVIDER_SITE_OTHER): Payer: Self-pay | Admitting: Orthopaedic Surgery

## 2018-09-17 VITALS — BP 152/87 | HR 82 | Ht 65.0 in | Wt 129.0 lb

## 2018-09-17 DIAGNOSIS — M4316 Spondylolisthesis, lumbar region: Secondary | ICD-10-CM | POA: Diagnosis not present

## 2018-09-17 DIAGNOSIS — M47816 Spondylosis without myelopathy or radiculopathy, lumbar region: Secondary | ICD-10-CM | POA: Diagnosis not present

## 2018-09-18 NOTE — Progress Notes (Signed)
Office Visit Note   Patient: Jennifer Cruz           Date of Birth: 06-17-1931           MRN: [id number] Visit Date: 09/17/2018              Requested by: Asencion Noble, MD [address] [place], [place] [address] PCP: Asencion Noble, MD   Assessment & Plan: Visit Diagnoses:  1. Spondylolisthesis of lumbar region   2. Spondylosis without myelopathy or radiculopathy, lumbar region     Plan: Patient's had some response to epidurals in the past no longer working.  We reviewed the MRI scan and discussed operative options including single level cervical fusion.  At present she can ambulate in the community can go up and down steps does not use a cane is not taking any pain medication.  She gets progressive of her symptoms we consider operative intervention but currently her back pain problems not preventing her from any activities and L5-S1 is collapsed down template abutment and is not likely to significantly progressed.  She gets increased symptoms she can return or call if she like to proceed with operative intervention.  Follow-Up Instructions: Return if symptoms worsen or fail to improve.   Orders:  No orders of the defined types were placed in this encounter.  No orders of the defined types were placed in this encounter.     Procedures: No procedures performed   Clinical Data: No additional findings.   Subjective: Chief Complaint  Patient presents with  . Lower Back - Pain    HPI 85 year old female who is been followed by Dr. Durward Fortes is here concerning problems with her back with L5-S1 grade 2 spondylolisthesis with disc degeneration and bilateral pars defects L5.  Patient has severe facet arthropathy bilateral pars defects and bilateral lateral recess stenosis and severe bilateral foraminal stenosis mild central stenosis.  Mild changes at other levels.  She lives in house with multiple levels is able to walk up and down steps does not use cane or walker.  She is a  Hydrographic surveyor.  She denies neurogenic claudication symptoms.  Review of Systems positive for depression, anxiety, Alzheimer's, left hip pain, L5-S1 spondylolisthesis, palpitation, visual impairment, memory loss, anxiety depression, hyperlipidemia, hearing loss, pulmonary nodule negative for lumbar cancer.  14 point review of systems otherwise negative   Objective: Vital Signs: BP (!) 152/87   Pulse 82   Ht 5\' 5"  (1.651 m)   Wt 129 lb (58.5 kg)   BMI 21.47 kg/m   Physical Exam Constitutional:      Appearance: She is well-developed.  HENT:     Head: Normocephalic.     Right Ear: External ear normal.     Left Ear: External ear normal.  Eyes:     Pupils: Pupils are equal, round, and reactive to light.  Neck:     Thyroid: No thyromegaly.     Trachea: No tracheal deviation.  Cardiovascular:     Rate and Rhythm: Normal rate.  Pulmonary:     Effort: Pulmonary effort is normal.  Abdominal:     Palpations: Abdomen is soft.  Skin:    General: Skin is warm and dry.  Neurological:     Mental Status: She is alert and oriented to person, place, and time.  Psychiatric:        Behavior: Behavior normal.     Ortho Exam patient can ambulate she can heel and toe walk.  Negative straight  leg raising 90 degrees some hamstring tightness.  Ankle dorsiflexion plantarflexion is good resistance lateral dorsal foot sensation is intact.  Distal pulses are intact.  Specialty Comments:  No specialty comments available.  Imaging: No results found.   PMFS History: Patient Active Problem List   Diagnosis Date Noted  . Major depressive disorder, recurrent episode, moderate ([place]) 08/10/2018  . Generalized anxiety disorder 08/10/2018  . Chronic bilateral low back pain without sciatica 04/22/2018  . Alzheimer disease ([place]) 04/16/2017  . Chronic left-sided low back pain with left-sided sciatica 11/04/2016  . Pain in left hip 11/04/2016  . Spondylolisthesis of lumbar region 11/04/2016  .  Spondylosis without myelopathy or radiculopathy, lumbar region 11/04/2016  . Change in stool caliber 01/05/2015  . Encounter for screening colonoscopy 04/30/2012  . RLQ fullness 04/30/2012  . PALPITATIONS 02/23/2009  . VISUAL IMPAIRMENT 05/11/2008  . OSTEOARTHRITIS, KNEES, BILATERAL 02/18/2008  . ELEVATED BLOOD PRESSURE WITHOUT DIAGNOSIS OF HYPERTENSION 11/17/2007  . MEMORY LOSS 08/17/2007  . MRI, BRAIN, ABNORMAL 08/17/2007  . OSTEOPENIA 07/08/2007  . ANXIETY DEPRESSION 05/08/2007  . HYPERLIPIDEMIA 04/24/2007  . DECREASED HEARING 04/24/2007  . PULMONARY NODULE 04/24/2007   Past Medical History:  Diagnosis Date  . Alzheimer disease ([place]) 04/16/2017  . Anxiety   . Depression   . Diverticulosis   . Dysrhythmia    palpitations  . Hyperlipidemia   . Hypertension   . Macular degeneration   . Memory loss    work up negative for Alzheimer's per medical records.  . Osteoarthritis   . Pulmonary nodules    per patient work-up completed at [facility] and no further evaluation needed    Family History  Problem Relation Age of Onset  . Diabetes Father   . Stroke Father   . Alzheimer's disease Mother     Past Surgical History:  Procedure Laterality Date  . BREAST ENHANCEMENT SURGERY    . CATARACT EXTRACTION  2009  . COLONOSCOPY WITH PROPOFOL N/A 09/17/2012   Dr.Rourk- normal rectum, pancolonic diverticulosis: o/w the remainder of the colonic mucosa appeared normal. it is notable the left colon was somewhat stiff and noncompliant.   . incomplete colonoscopy  2005   inadequate sedation, [facility]  . REFRACTIVE SURGERY  2010  . TONSILLECTOMY    . TOTAL KNEE ARTHROPLASTY  05/2009   right   Social History   Occupational History  . Occupation: Retired    Comment: Patent attorney, Training and development officer  Tobacco Use  . Smoking status: Former Smoker    Packs/day: 0.30    Years: 2.00    Pack years: 0.60    Types: Cigarettes    Last attempt to quit: 09/14/1952    Years since quitting: 66.0  . Smokeless  tobacco: Never Used  . Tobacco comment: quit in college  Substance and Sexual Activity  . Alcohol use: Yes    Alcohol/week: 1.0 standard drinks    Types: 1 Glasses of wine per week    Comment: a glass of red wine every night  . Drug use: No  . Sexual activity: Yes    Birth control/protection: Post-menopausal

## 2018-11-03 ENCOUNTER — Other Ambulatory Visit: Payer: Self-pay | Admitting: Neurology

## 2018-11-16 ENCOUNTER — Ambulatory Visit: Payer: Medicare HMO | Admitting: Neurology

## 2018-12-17 ENCOUNTER — Telehealth: Payer: Self-pay | Admitting: Neurology

## 2018-12-17 NOTE — Telephone Encounter (Signed)
Pt called stating that her memory issues have worsen and is wanting to be seen. Pt consented to a Virtual Visit and for the insurance to be billed as such. Link has been sent to updated e-mail in the chart.

## 2018-12-17 NOTE — Telephone Encounter (Signed)
I was able to reach the pt's husband and the pt to discuss 12/23/18 video visit.  I was able to confirm with the pt she had received the link. Pt understands to sign on to our virtual waiting room at 145 pm.   Pt's EMR has been updated for pending visit.

## 2018-12-17 NOTE — Telephone Encounter (Signed)
Noted  

## 2018-12-23 ENCOUNTER — Other Ambulatory Visit: Payer: Self-pay | Admitting: Neurology

## 2018-12-23 ENCOUNTER — Encounter: Payer: Self-pay | Admitting: Neurology

## 2018-12-23 ENCOUNTER — Other Ambulatory Visit: Payer: Self-pay

## 2018-12-23 ENCOUNTER — Ambulatory Visit (INDEPENDENT_AMBULATORY_CARE_PROVIDER_SITE_OTHER): Payer: Medicare HMO | Admitting: Neurology

## 2018-12-23 DIAGNOSIS — G301 Alzheimer's disease with late onset: Secondary | ICD-10-CM

## 2018-12-23 DIAGNOSIS — F0281 Dementia in other diseases classified elsewhere with behavioral disturbance: Secondary | ICD-10-CM

## 2018-12-23 DIAGNOSIS — F341 Dysthymic disorder: Secondary | ICD-10-CM

## 2018-12-23 DIAGNOSIS — F02818 Dementia in other diseases classified elsewhere, unspecified severity, with other behavioral disturbance: Secondary | ICD-10-CM

## 2018-12-23 MED ORDER — SERTRALINE HCL 100 MG PO TABS: 100.0000 mg | ORAL_TABLET | Freq: Every day | ORAL | 2 refills | Status: DC

## 2018-12-23 NOTE — Progress Notes (Signed)
     Virtual Visit via Telephone Note  I connected with Gayland Curry on 12/23/18 at  2:00 PM EDT by telephone and verified that I am speaking with the correct person using two identifiers.  Location: Patient: The patient is at home.  Provider: The physician is in office.   I discussed the limitations, risks, security and privacy concerns of performing an evaluation and management service by telephone and the availability of in person appointments. I also discussed with the patient that there may be a patient responsible charge related to this service. The patient expressed understanding and agreed to proceed.   History of Present Illness: Virgina Deakins is an 85 year old right-handed white female with a history of a progressive memory disturbance.  The patient has continued to have some problems with episodes of depression or agitation that may occur on average about once a week.  The patient is on Zoloft 75 mg daily, the increase from 50 mg not help much.  The patient has a strong family history of depression.  She remains on Namzeric, she tolerates the medication well.   Observations/Objective: The patient was unable to connect by video, we did a telephone interview with plans to do a face-to-face visit soon.  Assessment and Plan: 1.  Memory disturbance  2.  Anxiety and depression  We will go up on Zoloft 200 mg daily for a week then go to 125 mg daily for a week and then go to 150 mg daily.  We will get a revisit set up in about 4 weeks.  Follow Up Instructions:   Four-week follow-up with me. I discussed the assessment and treatment plan with the patient. The patient was provided an opportunity to ask questions and all were answered. The patient agreed with the plan and demonstrated an understanding of the instructions.   The patient was advised to call back or seek an in-person evaluation if the symptoms worsen or if the condition fails to improve as anticipated.  I  provided 15 minutes of non-face-to-face time during this encounter.   Kathrynn Ducking, MD

## 2018-12-31 ENCOUNTER — Other Ambulatory Visit: Payer: Self-pay | Admitting: Neurology

## 2019-01-14 DIAGNOSIS — H401222 Low-tension glaucoma, left eye, moderate stage: Secondary | ICD-10-CM | POA: Diagnosis not present

## 2019-01-14 DIAGNOSIS — H524 Presbyopia: Secondary | ICD-10-CM | POA: Diagnosis not present

## 2019-01-14 DIAGNOSIS — H25013 Cortical age-related cataract, bilateral: Secondary | ICD-10-CM | POA: Diagnosis not present

## 2019-01-14 DIAGNOSIS — H4010X4 Unspecified open-angle glaucoma, indeterminate stage: Secondary | ICD-10-CM | POA: Diagnosis not present

## 2019-01-18 ENCOUNTER — Telehealth: Payer: Self-pay | Admitting: Neurology

## 2019-01-18 MED ORDER — SERTRALINE HCL 100 MG PO TABS: 100.0000 mg | ORAL_TABLET | Freq: Every day | ORAL | 3 refills | Status: DC

## 2019-01-18 NOTE — Addendum Note (Signed)
Addended by: Verlin Grills T on: 01/18/2019 04:28 PM   Modules accepted: Orders

## 2019-01-18 NOTE — Telephone Encounter (Signed)
Pt husband has called asking if the sertraline (ZOLOFT) 100 MG tablet can be a 90 day script due to a lot of traveling from [place] to [facility].  Pt still wants to use [facility] #[id number].  Please call pt husband to discuss concerns about the mediation

## 2019-01-18 NOTE — Telephone Encounter (Signed)
I reached out the pt's husband ( ok per dpr). He is requesting a 90 day refill of Zoloft to be sent to the [facility] in [place] due to some up coming traveling. Rx has been sent and pt appt has been scheduled for 02/17/19 at 230 pm check in time of 2.

## 2019-01-21 DIAGNOSIS — X32XXXD Exposure to sunlight, subsequent encounter: Secondary | ICD-10-CM | POA: Diagnosis not present

## 2019-01-21 DIAGNOSIS — L570 Actinic keratosis: Secondary | ICD-10-CM | POA: Diagnosis not present

## 2019-01-21 DIAGNOSIS — L905 Scar conditions and fibrosis of skin: Secondary | ICD-10-CM | POA: Diagnosis not present

## 2019-02-09 ENCOUNTER — Telehealth: Payer: Self-pay | Admitting: Neurology

## 2019-02-09 NOTE — Telephone Encounter (Signed)
Waiting on MD to sign.

## 2019-02-09 NOTE — Telephone Encounter (Signed)
The letter was signed.

## 2019-02-09 NOTE — Telephone Encounter (Signed)
I have completed letter with additional information and have placed in MD's office to review and sign if appropriate.

## 2019-02-09 NOTE — Telephone Encounter (Signed)
I have formulated letter and will print to have MD review and sign if appropriate.

## 2019-02-09 NOTE — Telephone Encounter (Signed)
Pts daughter Jennifer Cruz is calling in stating her mom is now living with her in [place] due to them selling their home. Her husband is a Insurance underwriter for [employer] and has been exposed to multiple cases and wants to know if a letter can be written stating her mom is a patient and she is high risk of catching the virus. And emailed to address below  email- [email or web address]

## 2019-02-09 NOTE — Telephone Encounter (Signed)
Pts daughter is calling in and stated on the letter it needs to state that Nyra is living with Donavan Burnet.

## 2019-02-10 NOTE — Telephone Encounter (Signed)
Letter provided to medical records for processing,

## 2019-02-17 ENCOUNTER — Ambulatory Visit: Payer: Self-pay | Admitting: Neurology

## 2019-03-31 ENCOUNTER — Telehealth: Payer: Self-pay | Admitting: Neurology

## 2019-03-31 MED ORDER — SERTRALINE HCL 100 MG PO TABS: 100.0000 mg | ORAL_TABLET | Freq: Every day | ORAL | 3 refills | Status: AC

## 2019-03-31 NOTE — Telephone Encounter (Signed)
Chart reviewed and refill is appropriate. Rx has been submitted.  

## 2019-03-31 NOTE — Telephone Encounter (Signed)
Pt requesting refills for sertraline (ZOLOFT) 100 MG tablet sent to [facility]

## 2019-04-01 ENCOUNTER — Ambulatory Visit: Payer: Self-pay | Admitting: Neurology

## 2019-04-12 DIAGNOSIS — L570 Actinic keratosis: Secondary | ICD-10-CM | POA: Diagnosis not present

## 2019-04-12 DIAGNOSIS — C44329 Squamous cell carcinoma of skin of other parts of face: Secondary | ICD-10-CM | POA: Diagnosis not present

## 2019-04-12 DIAGNOSIS — D485 Neoplasm of uncertain behavior of skin: Secondary | ICD-10-CM | POA: Diagnosis not present

## 2019-04-12 DIAGNOSIS — X32XXXA Exposure to sunlight, initial encounter: Secondary | ICD-10-CM | POA: Diagnosis not present

## 2019-04-12 DIAGNOSIS — L853 Xerosis cutis: Secondary | ICD-10-CM | POA: Diagnosis not present

## 2019-10-16 IMAGING — MR MR SHOULDER*R* W/O CM
4 of 5 series · 26 of 40 positions shown · non-contrast
Comparison: None.

CLINICAL DATA: 86-year-old female with right shoulder pain x5 weeks
without known injury.

EXAM:
MRI OF THE RIGHT SHOULDER WITHOUT CONTRAST
TECHNIQUE: Multiplanar, multisequence MR imaging of the shoulder was performed.
No intravenous contrast was administered.

[Series 4: t2fs_blade_cor · sagittal · 3.0mm · 0.54mm/px · 7 of 22 slices shown]
[im 1/22]
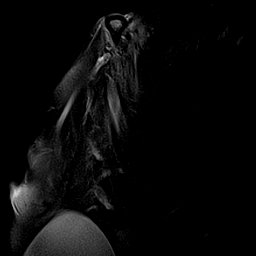
[im 4/22]
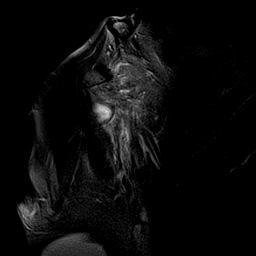
[im 8/22]
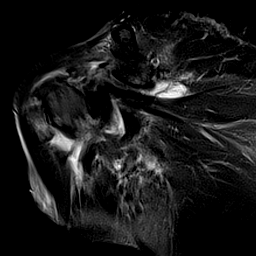
[im 11/22]
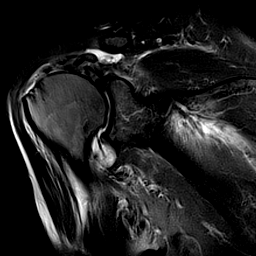
[im 15/22]
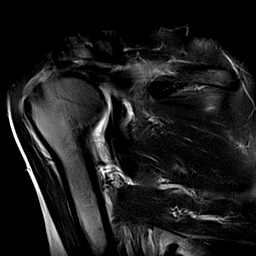
[im 18/22]
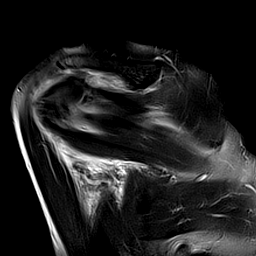
[im 22/22]
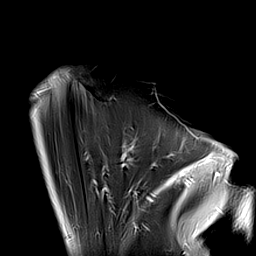

[Series 5: PD · sagittal · 3.0mm · 0.25mm/px · 7 of 20 slices shown]
[im 1/20]
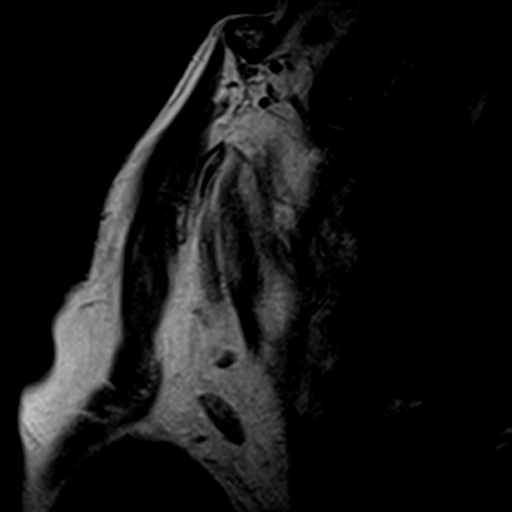
[im 4/20]
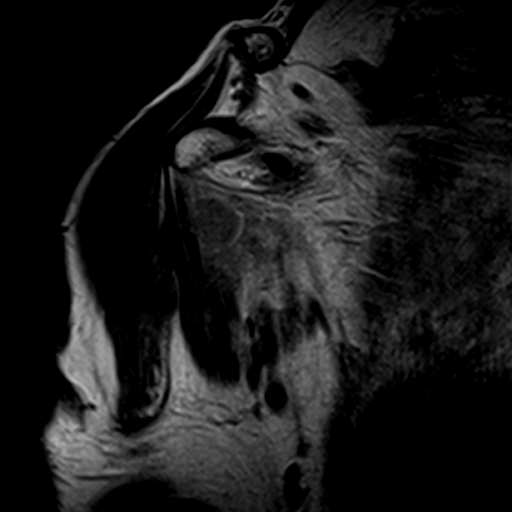
[im 7/20]
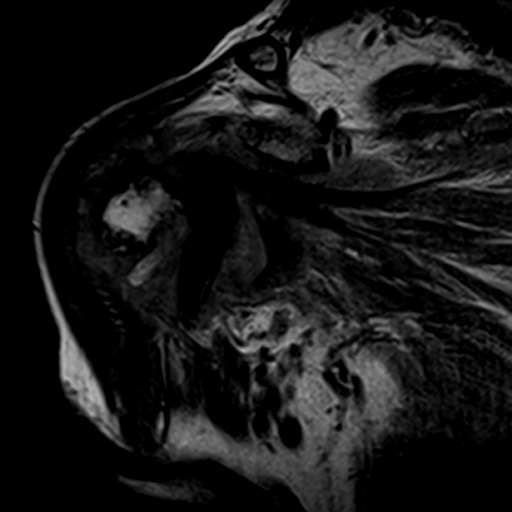
[im 10/20]
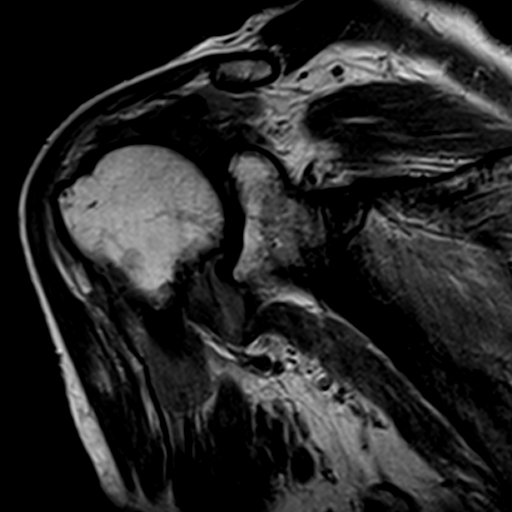
[im 13/20]
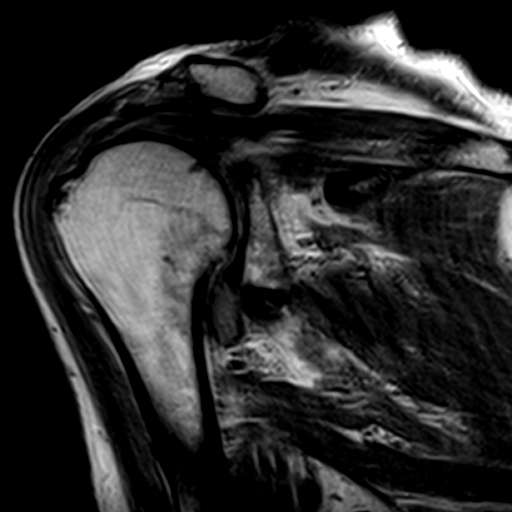
[im 16/20]
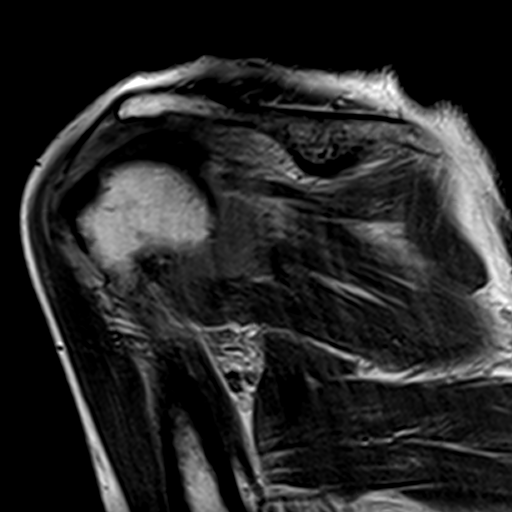
[im 20/20]
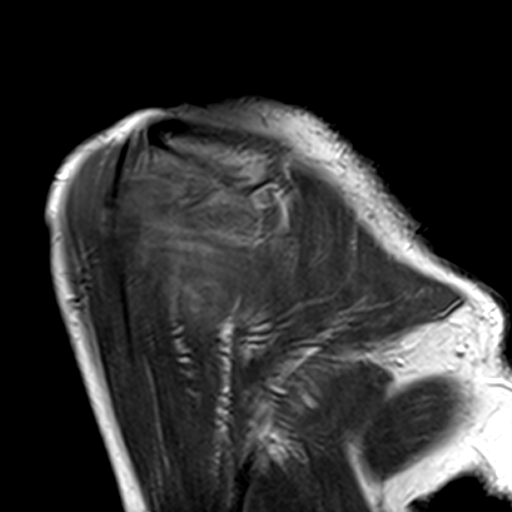

[Series 6: t2fs axial blade · axial · 3.0mm · 0.52mm/px · z∈[+4,+57]mm · 3 of 22 slices shown]
[im 4/22]
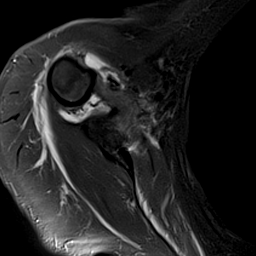
[im 13/22]
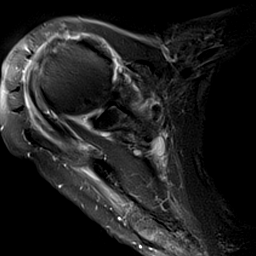
[im 19/22]
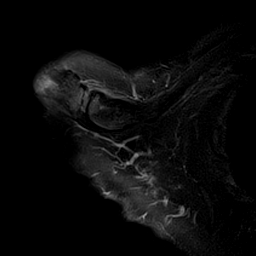

[Series 7: T1 · coronal · 3.0mm · 0.23mm/px · 9 of 24 slices shown]
[im 1/24]
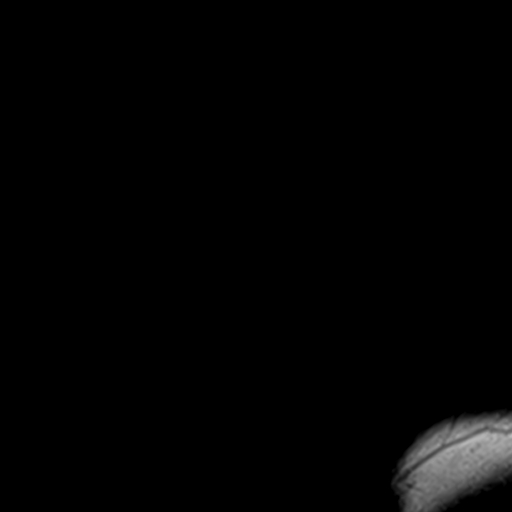
[im 3/24]
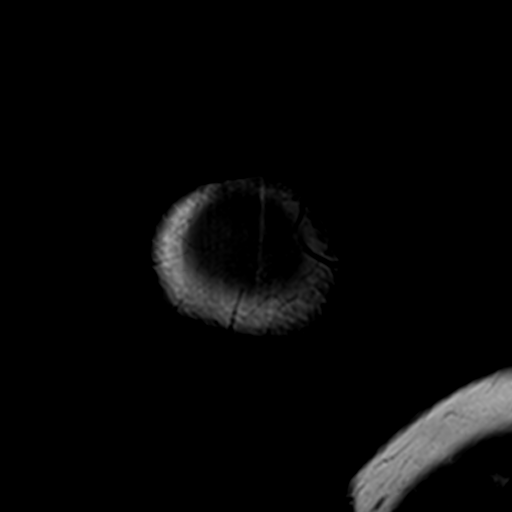
[im 6/24]
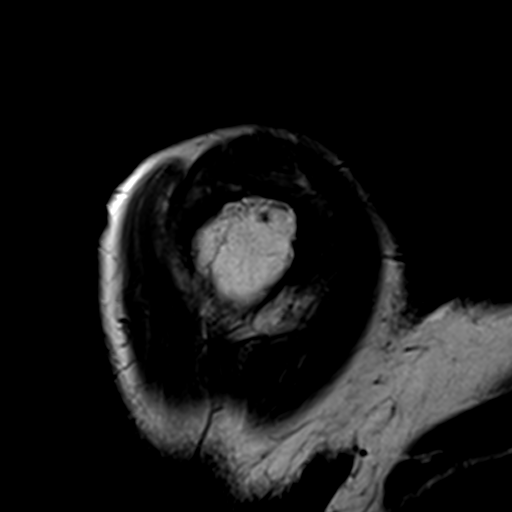
[im 9/24]
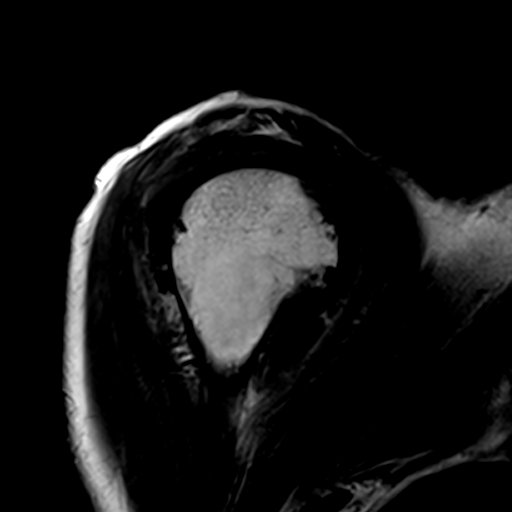
[im 12/24]
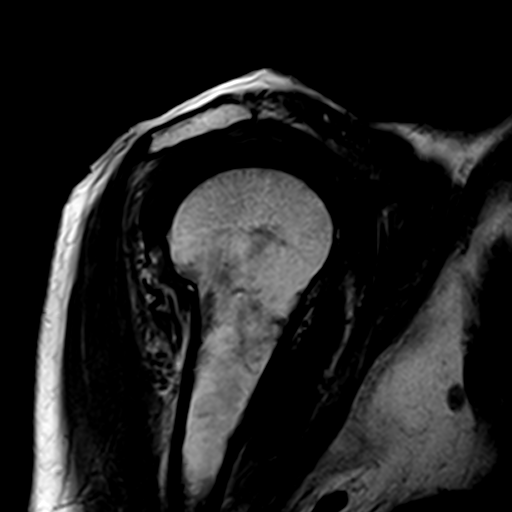
[im 15/24]
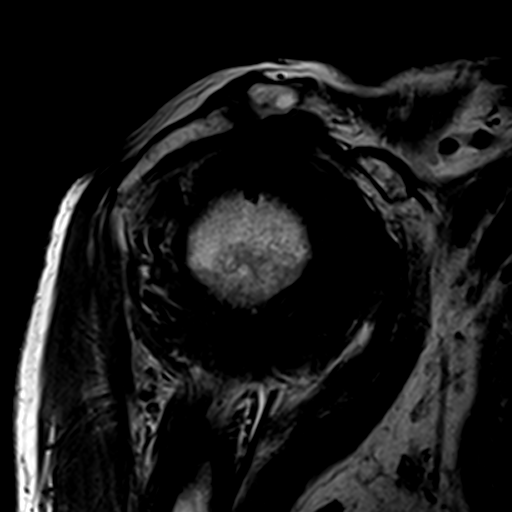
[im 18/24]
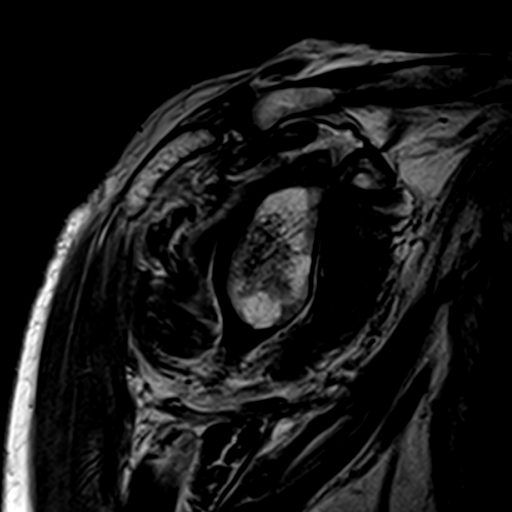
[im 21/24]
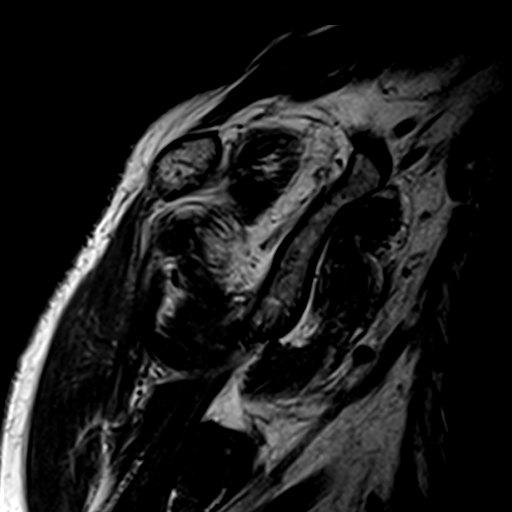
[im 24/24]
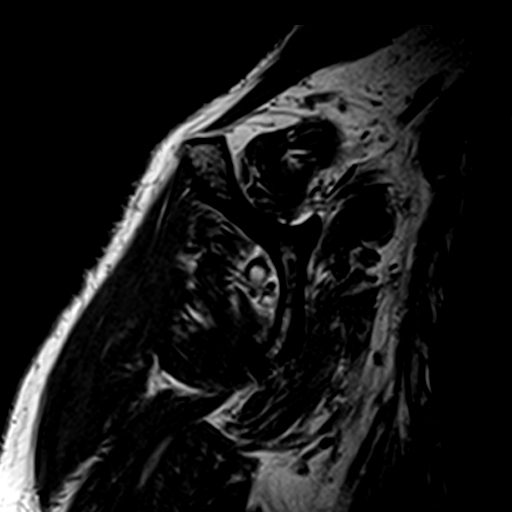

[26 of 40 positions shown; findings below may reference images not displayed]

FINDINGS: Limited study due to patient motion. Patient was unable to tolerate
repeat imaging further limiting the study.

Rotator cuff: Partial articular infraspinatus, full-thickness
supraspinatus with retraction up to 19 mm and partial articular
surface tear of the subscapularis tendon given the imaging
limitations.

Muscles:  No muscle atrophy.

Biceps long head:  The horizontal portion appears torn.

Acromioclavicular Joint: Moderate arthropathy of the
acromioclavicular joint. Type II, curved acromion. Moderate
subacromial and subdeltoid bursal fluid.

Glenohumeral Joint: Moderate thinning of the humeral and glenoid
cartilage with small to moderate joint effusion distending the
axillary pouch.

Labrum:  Intact.

Bones: No marrow abnormality, acute fracture or dislocation.
Degenerative spurring is noted off the humeral head with small soft
tissue ossifications anteriorly.

Other: None
IMPRESSION: 1. Limited study due to patient motion. Patient was unable to
tolerate repeat imaging for better assessment. There are partial
articular infraspinatus, full-thickness supraspinatus and partial
articular subscapularis tendon tears of the rotator cuff.
2. Torn horizontal portion of the biceps tendon.
3. Osteoarthritis of the glenohumeral and AC joints with chondrosis
involving the glenohumeral joint.
4. Subacromial and subdeltoid bursitis.
